# Patient Record
Sex: Female | Born: 1945
Health system: Southern US, Community
[De-identification: ages and names within clinical notes are randomized; demographics above are authoritative.]

## PROBLEM LIST (undated history)

## (undated) DIAGNOSIS — E785 Hyperlipidemia, unspecified: Secondary | ICD-10-CM

## (undated) DIAGNOSIS — F32A Depression, unspecified: Secondary | ICD-10-CM

## (undated) DIAGNOSIS — T7840XA Allergy, unspecified, initial encounter: Secondary | ICD-10-CM

## (undated) DIAGNOSIS — F329 Major depressive disorder, single episode, unspecified: Secondary | ICD-10-CM

## (undated) DIAGNOSIS — R002 Palpitations: Secondary | ICD-10-CM

## (undated) DIAGNOSIS — F419 Anxiety disorder, unspecified: Secondary | ICD-10-CM

## (undated) DIAGNOSIS — R609 Edema, unspecified: Secondary | ICD-10-CM

## (undated) DIAGNOSIS — I10 Essential (primary) hypertension: Secondary | ICD-10-CM

## (undated) DIAGNOSIS — M8580 Other specified disorders of bone density and structure, unspecified site: Secondary | ICD-10-CM

## (undated) DIAGNOSIS — N951 Menopausal and female climacteric states: Secondary | ICD-10-CM

## (undated) HISTORY — DX: Anxiety disorder, unspecified: F41.9

## (undated) HISTORY — DX: Essential (primary) hypertension: I10

## (undated) HISTORY — DX: Palpitations: R00.2

## (undated) HISTORY — DX: Menopausal and female climacteric states: N95.1

## (undated) HISTORY — DX: Allergy, unspecified, initial encounter: T78.40XA

## (undated) HISTORY — DX: Major depressive disorder, single episode, unspecified: F32.9

## (undated) HISTORY — DX: Other specified disorders of bone density and structure, unspecified site: M85.80

## (undated) HISTORY — DX: Hyperlipidemia, unspecified: E78.5

## (undated) HISTORY — DX: Edema, unspecified: R60.9

## (undated) HISTORY — DX: Depression, unspecified: F32.A

---

## 1976-08-16 HISTORY — PX: ABDOMINAL HYSTERECTOMY: SHX81

## 2010-11-19 ENCOUNTER — Ambulatory Visit: Payer: Self-pay | Admitting: Family Medicine

## 2011-03-20 ENCOUNTER — Emergency Department: Payer: Self-pay | Admitting: *Deleted

## 2011-04-01 ENCOUNTER — Ambulatory Visit: Payer: Self-pay | Admitting: Cardiovascular Disease

## 2011-07-14 DIAGNOSIS — Q245 Malformation of coronary vessels: Secondary | ICD-10-CM | POA: Insufficient documentation

## 2011-08-02 ENCOUNTER — Ambulatory Visit: Payer: Self-pay | Admitting: Otolaryngology

## 2011-10-27 DIAGNOSIS — Z1331 Encounter for screening for depression: Secondary | ICD-10-CM | POA: Diagnosis not present

## 2011-10-27 DIAGNOSIS — R002 Palpitations: Secondary | ICD-10-CM | POA: Diagnosis not present

## 2011-10-27 DIAGNOSIS — Z0000 Encounter for general adult medical examination without abnormal findings: Secondary | ICD-10-CM | POA: Diagnosis not present

## 2011-10-27 DIAGNOSIS — I10 Essential (primary) hypertension: Secondary | ICD-10-CM | POA: Diagnosis not present

## 2011-10-27 DIAGNOSIS — E7800 Pure hypercholesterolemia, unspecified: Secondary | ICD-10-CM | POA: Diagnosis not present

## 2011-12-09 DIAGNOSIS — R002 Palpitations: Secondary | ICD-10-CM | POA: Diagnosis not present

## 2011-12-09 DIAGNOSIS — I10 Essential (primary) hypertension: Secondary | ICD-10-CM | POA: Diagnosis not present

## 2011-12-09 DIAGNOSIS — E785 Hyperlipidemia, unspecified: Secondary | ICD-10-CM | POA: Diagnosis not present

## 2011-12-14 ENCOUNTER — Ambulatory Visit: Payer: Self-pay

## 2011-12-14 DIAGNOSIS — Z1231 Encounter for screening mammogram for malignant neoplasm of breast: Secondary | ICD-10-CM | POA: Diagnosis not present

## 2012-01-12 ENCOUNTER — Ambulatory Visit: Payer: Self-pay | Admitting: Otolaryngology

## 2012-01-12 DIAGNOSIS — E042 Nontoxic multinodular goiter: Secondary | ICD-10-CM | POA: Diagnosis not present

## 2012-01-12 DIAGNOSIS — E041 Nontoxic single thyroid nodule: Secondary | ICD-10-CM | POA: Diagnosis not present

## 2012-01-12 DIAGNOSIS — J988 Other specified respiratory disorders: Secondary | ICD-10-CM | POA: Diagnosis not present

## 2012-01-12 DIAGNOSIS — J398 Other specified diseases of upper respiratory tract: Secondary | ICD-10-CM | POA: Diagnosis not present

## 2012-01-17 DIAGNOSIS — E041 Nontoxic single thyroid nodule: Secondary | ICD-10-CM | POA: Diagnosis not present

## 2012-01-17 DIAGNOSIS — E0590 Thyrotoxicosis, unspecified without thyrotoxic crisis or storm: Secondary | ICD-10-CM | POA: Diagnosis not present

## 2012-04-10 DIAGNOSIS — R002 Palpitations: Secondary | ICD-10-CM | POA: Diagnosis not present

## 2012-04-10 DIAGNOSIS — E785 Hyperlipidemia, unspecified: Secondary | ICD-10-CM | POA: Diagnosis not present

## 2012-04-10 DIAGNOSIS — I10 Essential (primary) hypertension: Secondary | ICD-10-CM | POA: Diagnosis not present

## 2012-04-24 DIAGNOSIS — R011 Cardiac murmur, unspecified: Secondary | ICD-10-CM | POA: Diagnosis not present

## 2012-04-27 DIAGNOSIS — I10 Essential (primary) hypertension: Secondary | ICD-10-CM | POA: Diagnosis not present

## 2012-04-27 DIAGNOSIS — E785 Hyperlipidemia, unspecified: Secondary | ICD-10-CM | POA: Diagnosis not present

## 2012-04-27 DIAGNOSIS — E7800 Pure hypercholesterolemia, unspecified: Secondary | ICD-10-CM | POA: Diagnosis not present

## 2012-07-18 DIAGNOSIS — J343 Hypertrophy of nasal turbinates: Secondary | ICD-10-CM | POA: Diagnosis not present

## 2012-07-18 DIAGNOSIS — E0590 Thyrotoxicosis, unspecified without thyrotoxic crisis or storm: Secondary | ICD-10-CM | POA: Diagnosis not present

## 2012-07-18 DIAGNOSIS — J342 Deviated nasal septum: Secondary | ICD-10-CM | POA: Diagnosis not present

## 2012-07-18 DIAGNOSIS — E041 Nontoxic single thyroid nodule: Secondary | ICD-10-CM | POA: Diagnosis not present

## 2012-08-25 DIAGNOSIS — E785 Hyperlipidemia, unspecified: Secondary | ICD-10-CM | POA: Diagnosis not present

## 2012-08-25 DIAGNOSIS — I10 Essential (primary) hypertension: Secondary | ICD-10-CM | POA: Diagnosis not present

## 2012-08-25 DIAGNOSIS — R002 Palpitations: Secondary | ICD-10-CM | POA: Diagnosis not present

## 2012-10-25 DIAGNOSIS — I10 Essential (primary) hypertension: Secondary | ICD-10-CM | POA: Diagnosis not present

## 2012-10-25 DIAGNOSIS — E7800 Pure hypercholesterolemia, unspecified: Secondary | ICD-10-CM | POA: Diagnosis not present

## 2012-10-25 DIAGNOSIS — E785 Hyperlipidemia, unspecified: Secondary | ICD-10-CM | POA: Diagnosis not present

## 2012-11-03 DIAGNOSIS — R7301 Impaired fasting glucose: Secondary | ICD-10-CM | POA: Diagnosis not present

## 2012-11-24 ENCOUNTER — Ambulatory Visit: Payer: Self-pay

## 2012-11-24 DIAGNOSIS — Z7189 Other specified counseling: Secondary | ICD-10-CM | POA: Diagnosis not present

## 2012-11-24 DIAGNOSIS — I10 Essential (primary) hypertension: Secondary | ICD-10-CM | POA: Diagnosis not present

## 2012-11-24 DIAGNOSIS — E119 Type 2 diabetes mellitus without complications: Secondary | ICD-10-CM | POA: Diagnosis not present

## 2012-11-24 DIAGNOSIS — E785 Hyperlipidemia, unspecified: Secondary | ICD-10-CM | POA: Diagnosis not present

## 2012-12-14 ENCOUNTER — Ambulatory Visit: Payer: Self-pay

## 2012-12-14 DIAGNOSIS — E119 Type 2 diabetes mellitus without complications: Secondary | ICD-10-CM | POA: Diagnosis not present

## 2012-12-14 DIAGNOSIS — M899 Disorder of bone, unspecified: Secondary | ICD-10-CM | POA: Diagnosis not present

## 2012-12-14 DIAGNOSIS — Z7189 Other specified counseling: Secondary | ICD-10-CM | POA: Diagnosis not present

## 2012-12-14 DIAGNOSIS — E785 Hyperlipidemia, unspecified: Secondary | ICD-10-CM | POA: Diagnosis not present

## 2012-12-14 DIAGNOSIS — M949 Disorder of cartilage, unspecified: Secondary | ICD-10-CM | POA: Diagnosis not present

## 2012-12-14 DIAGNOSIS — Z1231 Encounter for screening mammogram for malignant neoplasm of breast: Secondary | ICD-10-CM | POA: Diagnosis not present

## 2012-12-14 DIAGNOSIS — R928 Other abnormal and inconclusive findings on diagnostic imaging of breast: Secondary | ICD-10-CM | POA: Diagnosis not present

## 2012-12-14 DIAGNOSIS — I10 Essential (primary) hypertension: Secondary | ICD-10-CM | POA: Diagnosis not present

## 2012-12-14 DIAGNOSIS — N951 Menopausal and female climacteric states: Secondary | ICD-10-CM | POA: Diagnosis not present

## 2012-12-20 ENCOUNTER — Ambulatory Visit: Payer: Self-pay

## 2013-01-14 ENCOUNTER — Ambulatory Visit: Payer: Self-pay

## 2013-01-31 DIAGNOSIS — I10 Essential (primary) hypertension: Secondary | ICD-10-CM | POA: Diagnosis not present

## 2013-01-31 DIAGNOSIS — E119 Type 2 diabetes mellitus without complications: Secondary | ICD-10-CM | POA: Diagnosis not present

## 2013-01-31 DIAGNOSIS — E7800 Pure hypercholesterolemia, unspecified: Secondary | ICD-10-CM | POA: Diagnosis not present

## 2013-02-15 DIAGNOSIS — M76829 Posterior tibial tendinitis, unspecified leg: Secondary | ICD-10-CM | POA: Diagnosis not present

## 2013-02-15 DIAGNOSIS — L03039 Cellulitis of unspecified toe: Secondary | ICD-10-CM | POA: Diagnosis not present

## 2013-02-15 DIAGNOSIS — M79609 Pain in unspecified limb: Secondary | ICD-10-CM | POA: Diagnosis not present

## 2013-02-26 DIAGNOSIS — E785 Hyperlipidemia, unspecified: Secondary | ICD-10-CM | POA: Diagnosis not present

## 2013-02-26 DIAGNOSIS — R002 Palpitations: Secondary | ICD-10-CM | POA: Diagnosis not present

## 2013-02-26 DIAGNOSIS — I10 Essential (primary) hypertension: Secondary | ICD-10-CM | POA: Diagnosis not present

## 2013-03-01 DIAGNOSIS — L03039 Cellulitis of unspecified toe: Secondary | ICD-10-CM | POA: Diagnosis not present

## 2013-03-09 DIAGNOSIS — R011 Cardiac murmur, unspecified: Secondary | ICD-10-CM | POA: Diagnosis not present

## 2013-03-09 DIAGNOSIS — R002 Palpitations: Secondary | ICD-10-CM | POA: Diagnosis not present

## 2013-05-14 DIAGNOSIS — E785 Hyperlipidemia, unspecified: Secondary | ICD-10-CM | POA: Diagnosis not present

## 2013-05-14 DIAGNOSIS — E119 Type 2 diabetes mellitus without complications: Secondary | ICD-10-CM | POA: Diagnosis not present

## 2013-05-14 DIAGNOSIS — I10 Essential (primary) hypertension: Secondary | ICD-10-CM | POA: Diagnosis not present

## 2013-05-14 DIAGNOSIS — Z0000 Encounter for general adult medical examination without abnormal findings: Secondary | ICD-10-CM | POA: Diagnosis not present

## 2013-05-14 DIAGNOSIS — E7800 Pure hypercholesterolemia, unspecified: Secondary | ICD-10-CM | POA: Diagnosis not present

## 2013-05-15 DIAGNOSIS — Z0000 Encounter for general adult medical examination without abnormal findings: Secondary | ICD-10-CM | POA: Diagnosis not present

## 2013-05-15 DIAGNOSIS — E119 Type 2 diabetes mellitus without complications: Secondary | ICD-10-CM | POA: Diagnosis not present

## 2013-05-31 DIAGNOSIS — M76829 Posterior tibial tendinitis, unspecified leg: Secondary | ICD-10-CM | POA: Diagnosis not present

## 2013-05-31 DIAGNOSIS — M79609 Pain in unspecified limb: Secondary | ICD-10-CM | POA: Diagnosis not present

## 2013-06-18 DIAGNOSIS — H269 Unspecified cataract: Secondary | ICD-10-CM | POA: Diagnosis not present

## 2013-06-18 DIAGNOSIS — H524 Presbyopia: Secondary | ICD-10-CM | POA: Diagnosis not present

## 2013-06-18 DIAGNOSIS — H5200 Hypermetropia, unspecified eye: Secondary | ICD-10-CM | POA: Diagnosis not present

## 2013-06-18 DIAGNOSIS — H52229 Regular astigmatism, unspecified eye: Secondary | ICD-10-CM | POA: Diagnosis not present

## 2013-06-21 DIAGNOSIS — M79609 Pain in unspecified limb: Secondary | ICD-10-CM | POA: Diagnosis not present

## 2013-06-21 DIAGNOSIS — M76829 Posterior tibial tendinitis, unspecified leg: Secondary | ICD-10-CM | POA: Diagnosis not present

## 2013-07-30 DIAGNOSIS — I10 Essential (primary) hypertension: Secondary | ICD-10-CM | POA: Diagnosis not present

## 2013-07-30 DIAGNOSIS — R079 Chest pain, unspecified: Secondary | ICD-10-CM | POA: Diagnosis not present

## 2013-07-30 DIAGNOSIS — R002 Palpitations: Secondary | ICD-10-CM | POA: Diagnosis not present

## 2013-08-01 DIAGNOSIS — R072 Precordial pain: Secondary | ICD-10-CM | POA: Diagnosis not present

## 2013-08-01 DIAGNOSIS — R079 Chest pain, unspecified: Secondary | ICD-10-CM | POA: Diagnosis not present

## 2013-08-03 DIAGNOSIS — I059 Rheumatic mitral valve disease, unspecified: Secondary | ICD-10-CM | POA: Diagnosis not present

## 2013-08-03 DIAGNOSIS — R002 Palpitations: Secondary | ICD-10-CM | POA: Diagnosis not present

## 2013-08-03 DIAGNOSIS — I10 Essential (primary) hypertension: Secondary | ICD-10-CM | POA: Diagnosis not present

## 2013-08-03 DIAGNOSIS — E785 Hyperlipidemia, unspecified: Secondary | ICD-10-CM | POA: Diagnosis not present

## 2013-10-05 DIAGNOSIS — I10 Essential (primary) hypertension: Secondary | ICD-10-CM | POA: Diagnosis not present

## 2013-10-05 DIAGNOSIS — E785 Hyperlipidemia, unspecified: Secondary | ICD-10-CM | POA: Diagnosis not present

## 2013-10-05 DIAGNOSIS — R002 Palpitations: Secondary | ICD-10-CM | POA: Diagnosis not present

## 2013-10-19 DIAGNOSIS — E785 Hyperlipidemia, unspecified: Secondary | ICD-10-CM | POA: Diagnosis not present

## 2013-10-19 DIAGNOSIS — R002 Palpitations: Secondary | ICD-10-CM | POA: Diagnosis not present

## 2013-10-19 DIAGNOSIS — I10 Essential (primary) hypertension: Secondary | ICD-10-CM | POA: Diagnosis not present

## 2013-10-19 DIAGNOSIS — I059 Rheumatic mitral valve disease, unspecified: Secondary | ICD-10-CM | POA: Diagnosis not present

## 2013-11-12 DIAGNOSIS — E785 Hyperlipidemia, unspecified: Secondary | ICD-10-CM | POA: Diagnosis not present

## 2013-11-12 DIAGNOSIS — I10 Essential (primary) hypertension: Secondary | ICD-10-CM | POA: Diagnosis not present

## 2013-11-12 DIAGNOSIS — E7800 Pure hypercholesterolemia, unspecified: Secondary | ICD-10-CM | POA: Diagnosis not present

## 2013-11-12 DIAGNOSIS — E119 Type 2 diabetes mellitus without complications: Secondary | ICD-10-CM | POA: Diagnosis not present

## 2013-12-18 ENCOUNTER — Ambulatory Visit: Payer: Self-pay

## 2013-12-18 DIAGNOSIS — Z1231 Encounter for screening mammogram for malignant neoplasm of breast: Secondary | ICD-10-CM | POA: Diagnosis not present

## 2014-02-01 DIAGNOSIS — R002 Palpitations: Secondary | ICD-10-CM | POA: Diagnosis not present

## 2014-02-01 DIAGNOSIS — E785 Hyperlipidemia, unspecified: Secondary | ICD-10-CM | POA: Diagnosis not present

## 2014-02-01 DIAGNOSIS — I10 Essential (primary) hypertension: Secondary | ICD-10-CM | POA: Diagnosis not present

## 2014-05-13 DIAGNOSIS — M25473 Effusion, unspecified ankle: Secondary | ICD-10-CM | POA: Diagnosis not present

## 2014-05-13 DIAGNOSIS — E8881 Metabolic syndrome: Secondary | ICD-10-CM | POA: Diagnosis not present

## 2014-05-13 DIAGNOSIS — E785 Hyperlipidemia, unspecified: Secondary | ICD-10-CM | POA: Diagnosis not present

## 2014-05-13 DIAGNOSIS — I10 Essential (primary) hypertension: Secondary | ICD-10-CM | POA: Diagnosis not present

## 2014-05-13 DIAGNOSIS — M25476 Effusion, unspecified foot: Secondary | ICD-10-CM | POA: Diagnosis not present

## 2014-06-12 DIAGNOSIS — E875 Hyperkalemia: Secondary | ICD-10-CM | POA: Diagnosis not present

## 2014-08-05 DIAGNOSIS — E785 Hyperlipidemia, unspecified: Secondary | ICD-10-CM | POA: Diagnosis not present

## 2014-08-05 DIAGNOSIS — R002 Palpitations: Secondary | ICD-10-CM | POA: Diagnosis not present

## 2014-08-05 DIAGNOSIS — I10 Essential (primary) hypertension: Secondary | ICD-10-CM | POA: Diagnosis not present

## 2014-08-05 DIAGNOSIS — R079 Chest pain, unspecified: Secondary | ICD-10-CM | POA: Diagnosis not present

## 2014-08-05 DIAGNOSIS — I340 Nonrheumatic mitral (valve) insufficiency: Secondary | ICD-10-CM | POA: Diagnosis not present

## 2014-08-13 DIAGNOSIS — R7301 Impaired fasting glucose: Secondary | ICD-10-CM | POA: Diagnosis not present

## 2014-08-13 DIAGNOSIS — I10 Essential (primary) hypertension: Secondary | ICD-10-CM | POA: Diagnosis not present

## 2014-08-19 DIAGNOSIS — R079 Chest pain, unspecified: Secondary | ICD-10-CM | POA: Diagnosis not present

## 2014-08-22 DIAGNOSIS — R002 Palpitations: Secondary | ICD-10-CM | POA: Diagnosis not present

## 2014-08-22 DIAGNOSIS — I10 Essential (primary) hypertension: Secondary | ICD-10-CM | POA: Diagnosis not present

## 2014-08-22 DIAGNOSIS — E785 Hyperlipidemia, unspecified: Secondary | ICD-10-CM | POA: Diagnosis not present

## 2014-08-22 DIAGNOSIS — I340 Nonrheumatic mitral (valve) insufficiency: Secondary | ICD-10-CM | POA: Diagnosis not present

## 2014-08-22 DIAGNOSIS — R609 Edema, unspecified: Secondary | ICD-10-CM | POA: Diagnosis not present

## 2014-09-02 DIAGNOSIS — H52223 Regular astigmatism, bilateral: Secondary | ICD-10-CM | POA: Diagnosis not present

## 2014-09-02 DIAGNOSIS — I10 Essential (primary) hypertension: Secondary | ICD-10-CM | POA: Diagnosis not present

## 2014-09-02 DIAGNOSIS — H5203 Hypermetropia, bilateral: Secondary | ICD-10-CM | POA: Diagnosis not present

## 2014-09-02 DIAGNOSIS — H524 Presbyopia: Secondary | ICD-10-CM | POA: Diagnosis not present

## 2014-09-02 DIAGNOSIS — H2513 Age-related nuclear cataract, bilateral: Secondary | ICD-10-CM | POA: Diagnosis not present

## 2014-10-10 DIAGNOSIS — E785 Hyperlipidemia, unspecified: Secondary | ICD-10-CM | POA: Diagnosis not present

## 2014-10-10 DIAGNOSIS — I10 Essential (primary) hypertension: Secondary | ICD-10-CM | POA: Diagnosis not present

## 2014-10-10 DIAGNOSIS — R002 Palpitations: Secondary | ICD-10-CM | POA: Diagnosis not present

## 2014-10-24 ENCOUNTER — Ambulatory Visit: Payer: Self-pay | Admitting: Gastroenterology

## 2014-10-24 DIAGNOSIS — K5140 Inflammatory polyps of colon without complications: Secondary | ICD-10-CM | POA: Diagnosis not present

## 2014-10-24 DIAGNOSIS — K5730 Diverticulosis of large intestine without perforation or abscess without bleeding: Secondary | ICD-10-CM | POA: Diagnosis not present

## 2014-10-24 DIAGNOSIS — Z1211 Encounter for screening for malignant neoplasm of colon: Secondary | ICD-10-CM | POA: Diagnosis not present

## 2014-10-24 DIAGNOSIS — D122 Benign neoplasm of ascending colon: Secondary | ICD-10-CM | POA: Diagnosis not present

## 2014-10-24 DIAGNOSIS — Z7982 Long term (current) use of aspirin: Secondary | ICD-10-CM | POA: Diagnosis not present

## 2014-10-24 DIAGNOSIS — I10 Essential (primary) hypertension: Secondary | ICD-10-CM | POA: Diagnosis not present

## 2014-10-24 DIAGNOSIS — I499 Cardiac arrhythmia, unspecified: Secondary | ICD-10-CM | POA: Diagnosis not present

## 2014-10-24 DIAGNOSIS — K635 Polyp of colon: Secondary | ICD-10-CM | POA: Diagnosis not present

## 2014-10-24 DIAGNOSIS — K5790 Diverticulosis of intestine, part unspecified, without perforation or abscess without bleeding: Secondary | ICD-10-CM | POA: Diagnosis not present

## 2014-10-24 LAB — HM COLONOSCOPY

## 2014-12-03 DIAGNOSIS — E785 Hyperlipidemia, unspecified: Secondary | ICD-10-CM | POA: Diagnosis not present

## 2014-12-03 DIAGNOSIS — I340 Nonrheumatic mitral (valve) insufficiency: Secondary | ICD-10-CM | POA: Diagnosis not present

## 2014-12-03 DIAGNOSIS — I10 Essential (primary) hypertension: Secondary | ICD-10-CM | POA: Diagnosis not present

## 2014-12-03 DIAGNOSIS — R079 Chest pain, unspecified: Secondary | ICD-10-CM | POA: Diagnosis not present

## 2014-12-09 LAB — SURGICAL PATHOLOGY

## 2014-12-11 DIAGNOSIS — I10 Essential (primary) hypertension: Secondary | ICD-10-CM | POA: Diagnosis not present

## 2014-12-11 DIAGNOSIS — E785 Hyperlipidemia, unspecified: Secondary | ICD-10-CM | POA: Diagnosis not present

## 2014-12-11 DIAGNOSIS — R9430 Abnormal result of cardiovascular function study, unspecified: Secondary | ICD-10-CM | POA: Diagnosis not present

## 2014-12-11 DIAGNOSIS — R002 Palpitations: Secondary | ICD-10-CM | POA: Diagnosis not present

## 2014-12-12 ENCOUNTER — Other Ambulatory Visit: Payer: Self-pay

## 2014-12-12 DIAGNOSIS — Z1231 Encounter for screening mammogram for malignant neoplasm of breast: Secondary | ICD-10-CM

## 2014-12-16 DIAGNOSIS — R002 Palpitations: Secondary | ICD-10-CM | POA: Diagnosis not present

## 2014-12-16 DIAGNOSIS — R9430 Abnormal result of cardiovascular function study, unspecified: Secondary | ICD-10-CM | POA: Diagnosis not present

## 2014-12-16 DIAGNOSIS — E785 Hyperlipidemia, unspecified: Secondary | ICD-10-CM | POA: Diagnosis not present

## 2014-12-16 DIAGNOSIS — I10 Essential (primary) hypertension: Secondary | ICD-10-CM | POA: Diagnosis not present

## 2014-12-31 ENCOUNTER — Other Ambulatory Visit: Payer: Self-pay

## 2014-12-31 ENCOUNTER — Ambulatory Visit
Admission: RE | Admit: 2014-12-31 | Discharge: 2014-12-31 | Disposition: A | Discharge status: Home / Self Care | Payer: Medicare Other | Source: Ambulatory Visit | Attending: Unknown Physician Specialty | Admitting: Unknown Physician Specialty

## 2014-12-31 DIAGNOSIS — Z1231 Encounter for screening mammogram for malignant neoplasm of breast: Secondary | ICD-10-CM

## 2015-02-12 ENCOUNTER — Encounter: Payer: Self-pay | Admitting: Unknown Physician Specialty

## 2015-02-12 ENCOUNTER — Ambulatory Visit (INDEPENDENT_AMBULATORY_CARE_PROVIDER_SITE_OTHER): Payer: Medicare Other | Admitting: Unknown Physician Specialty

## 2015-02-12 DIAGNOSIS — R002 Palpitations: Secondary | ICD-10-CM | POA: Insufficient documentation

## 2015-02-12 DIAGNOSIS — E785 Hyperlipidemia, unspecified: Secondary | ICD-10-CM

## 2015-02-12 DIAGNOSIS — I159 Secondary hypertension, unspecified: Secondary | ICD-10-CM | POA: Diagnosis not present

## 2015-02-12 DIAGNOSIS — E1159 Type 2 diabetes mellitus with other circulatory complications: Secondary | ICD-10-CM | POA: Insufficient documentation

## 2015-02-12 DIAGNOSIS — E1169 Type 2 diabetes mellitus with other specified complication: Secondary | ICD-10-CM | POA: Insufficient documentation

## 2015-02-12 DIAGNOSIS — M85852 Other specified disorders of bone density and structure, left thigh: Secondary | ICD-10-CM | POA: Insufficient documentation

## 2015-02-12 DIAGNOSIS — E8881 Metabolic syndrome: Secondary | ICD-10-CM | POA: Diagnosis not present

## 2015-02-12 DIAGNOSIS — F419 Anxiety disorder, unspecified: Secondary | ICD-10-CM | POA: Insufficient documentation

## 2015-02-12 DIAGNOSIS — I152 Hypertension secondary to endocrine disorders: Secondary | ICD-10-CM | POA: Insufficient documentation

## 2015-02-12 DIAGNOSIS — M8580 Other specified disorders of bone density and structure, unspecified site: Secondary | ICD-10-CM | POA: Insufficient documentation

## 2015-02-12 DIAGNOSIS — R7301 Impaired fasting glucose: Secondary | ICD-10-CM | POA: Insufficient documentation

## 2015-02-12 DIAGNOSIS — F32A Depression, unspecified: Secondary | ICD-10-CM | POA: Insufficient documentation

## 2015-02-12 DIAGNOSIS — F329 Major depressive disorder, single episode, unspecified: Secondary | ICD-10-CM | POA: Insufficient documentation

## 2015-02-12 DIAGNOSIS — I10 Essential (primary) hypertension: Secondary | ICD-10-CM | POA: Insufficient documentation

## 2015-02-12 DIAGNOSIS — M25473 Effusion, unspecified ankle: Secondary | ICD-10-CM | POA: Insufficient documentation

## 2015-02-12 LAB — LIPID PANEL PICCOLO, WAIVED
Chol/HDL Ratio Piccolo,Waive: 3.1 mg/dL
Cholesterol Piccolo, Waived: 148 mg/dL (ref <200)
HDL Chol Piccolo, Waived: 47 mg/dL — ABNORMAL LOW (ref >59)
LDL CHOL CALC PICCOLO WAIVED: 84 mg/dL (ref <100)
Triglycerides Piccolo,Waived: 80 mg/dL (ref <150)
VLDL Chol Calc Piccolo,Waive: 16 mg/dL (ref <30)

## 2015-02-12 LAB — MICROALBUMIN, URINE WAIVED
Creatinine, Urine Waived: 200 mg/dL (ref 10–300)
MICROALB, UR WAIVED: 10 mg/L (ref 0–19)
Microalb/Creat Ratio: <30 mg/g (ref <30)

## 2015-02-12 NOTE — Assessment & Plan Note (Signed)
Stable.  Continue current medications.

## 2015-02-12 NOTE — Assessment & Plan Note (Signed)
Check Glucose

## 2015-02-12 NOTE — Patient Instructions (Signed)
Stop Furosemide as I think the combination with HCTZ is to much.  Stop KCL tablet.    If you take magnesium, look for Magnesium Citrate aor Glycerate.  Don't take Magnesium Oxide.  It is not well absorbed.

## 2015-02-12 NOTE — Progress Notes (Signed)
   BP 119/73 mmHg  Pulse 69  Temp(Src) 98.3 F (36.8 C)  Ht 5' 5.9" (1.674 m)  Wt 202 lb 6.4 oz (91.808 kg)  BMI 32.76 kg/m2  SpO2 98%  LMP  (LMP Unknown)   Subjective:    Patient ID: Denise Morris, female    DOB: Jun 21, 1946, 69 y.o.   MRN: [id number]  HPI: Denise Morris is a 69 y.o. female  Chief Complaint  Patient presents with  . Hypertension  . Hyperlipidemia  . Medication Problem    pt states medication is causing her to be dizzy (losartan)   Hypertension This is a chronic (Feels Losartan is making her dizzy.  States Dr. Humphrey Rolls increased her Losartan HCTZ but also taking Fuosemide) problem. The problem is controlled. Associated symptoms include peripheral edema. Pertinent negatives include no chest pain or shortness of breath. Associated agents: has palpitations and sees Dr. Humphrey Rolls. There are no compliance problems.   Hyperlipidemia This is a chronic problem. The problem is controlled. Pertinent negatives include no chest pain, focal sensory loss, focal weakness, leg pain, myalgias or shortness of breath. Current antihyperlipidemic treatment includes statins.    Relevant past medical, surgical, family and social history reviewed and updated as indicated. Interim medical history since our last visit reviewed. Allergies and medications reviewed and updated.  Review of Systems  Respiratory: Negative for shortness of breath.   Cardiovascular: Negative for chest pain.  Musculoskeletal: Negative for myalgias.  Neurological: Negative for focal weakness.    Per HPI unless specifically indicated above     Objective:    BP 119/73 mmHg  Pulse 69  Temp(Src) 98.3 F (36.8 C)  Ht 5' 5.9" (1.674 m)  Wt 202 lb 6.4 oz (91.808 kg)  BMI 32.76 kg/m2  SpO2 98%  LMP  (LMP Unknown)  Wt Readings from Last 3 Encounters:  02/12/15 202 lb 6.4 oz (91.808 kg)  08/13/14 206 lb (93.441 kg)    Physical Exam  Constitutional: She is oriented to person, place, and time. She appears  well-developed and well-nourished. No distress.  HENT:  Head: Normocephalic and atraumatic.  Eyes: Conjunctivae and lids are normal. Right eye exhibits no discharge. Left eye exhibits no discharge. No scleral icterus.  Cardiovascular: Normal rate and regular rhythm.   Pulmonary/Chest: Effort normal and breath sounds normal. No respiratory distress.  Abdominal: Normal appearance and bowel sounds are normal. She exhibits no distension. There is no splenomegaly or hepatomegaly. There is no tenderness.  Musculoskeletal: Normal range of motion.  Neurological: She is alert and oriented to person, place, and time.  Skin: Skin is intact. No rash noted. No pallor.  Psychiatric: She has a normal mood and affect. Her behavior is normal. Judgment and thought content normal.   Assessment & Plan:   Problem List Items Addressed This Visit      Cardiovascular and Mediastinum   Hypertension    Stable but dizzy and light headed.  Stop Furosemide and KCL      Relevant Orders   Comprehensive metabolic panel   Microalbumin, Urine Waived     Other   Hyperlipidemia    Stable.  Continue current medications      Relevant Orders   Lipid Panel Piccolo, Waived   Metabolic syndrome    Check Glucose          Follow up plan: Return in about 6 months (around 08/14/2015) for P.

## 2015-02-12 NOTE — Assessment & Plan Note (Signed)
Stable but dizzy and light headed.  Stop Furosemide and KCL

## 2015-02-13 LAB — COMPREHENSIVE METABOLIC PANEL
ALT: 14 IU/L (ref 0–32)
AST: 18 IU/L (ref 0–40)
Albumin/Globulin Ratio: 1.5 (ref 1.1–2.5)
Albumin: 3.8 g/dL (ref 3.5–4.8)
Alkaline Phosphatase: 76 IU/L (ref 39–117)
BUN/Creatinine Ratio: 16 (ref 11–26)
BUN: 16 mg/dL (ref 8–27)
Bilirubin Total: 0.6 mg/dL (ref 0.0–1.2)
CALCIUM: 9.1 mg/dL (ref 8.7–10.3)
CO2: 26 mmol/L (ref 18–29)
Chloride: 101 mmol/L (ref 97–108)
Creatinine, Ser: 0.98 mg/dL (ref 0.57–1.00)
GFR calc Af Amer: 67 mL/min/{1.73_m2} (ref >59)
GFR calc non Af Amer: 58 mL/min/{1.73_m2} — ABNORMAL LOW (ref >59)
GLOBULIN, TOTAL: 2.6 g/dL (ref 1.5–4.5)
Glucose: 109 mg/dL — ABNORMAL HIGH (ref 65–99)
Potassium: 3.7 mmol/L (ref 3.5–5.2)
SODIUM: 142 mmol/L (ref 134–144)
Total Protein: 6.4 g/dL (ref 6.0–8.5)

## 2015-02-13 NOTE — Progress Notes (Signed)
Quick Note:  Call tell labs normal except kidney function just a little low. No real change form last visit. Treatment is keeping blood pressure under control which it is. Also avoid anti-inflammatories such as Ibuprofen and Advil. Tylenol in moderation is OK. We will continue to keep an eye on it. She is at 58% and the goal is above 60%. ______

## 2015-04-04 ENCOUNTER — Other Ambulatory Visit: Payer: Self-pay

## 2015-04-04 MED ORDER — PRAVASTATIN SODIUM 40 MG PO TABS: 40.0000 mg | ORAL_TABLET | Freq: Every day | ORAL | Status: DC

## 2015-04-04 NOTE — Telephone Encounter (Signed)
Patient was last seen 02/12/15 and pharmacy is [facility] in [place].

## 2015-04-24 DIAGNOSIS — I340 Nonrheumatic mitral (valve) insufficiency: Secondary | ICD-10-CM | POA: Diagnosis not present

## 2015-04-24 DIAGNOSIS — R002 Palpitations: Secondary | ICD-10-CM | POA: Diagnosis not present

## 2015-04-24 DIAGNOSIS — E785 Hyperlipidemia, unspecified: Secondary | ICD-10-CM | POA: Diagnosis not present

## 2015-04-24 DIAGNOSIS — I10 Essential (primary) hypertension: Secondary | ICD-10-CM | POA: Diagnosis not present

## 2015-07-03 ENCOUNTER — Other Ambulatory Visit: Payer: Self-pay | Admitting: Unknown Physician Specialty

## 2015-08-20 ENCOUNTER — Encounter: Payer: Medicare Other | Admitting: Unknown Physician Specialty

## 2015-09-16 ENCOUNTER — Ambulatory Visit (INDEPENDENT_AMBULATORY_CARE_PROVIDER_SITE_OTHER): Payer: Medicare Other | Admitting: Unknown Physician Specialty

## 2015-09-16 ENCOUNTER — Encounter: Payer: Self-pay | Admitting: Unknown Physician Specialty

## 2015-09-16 VITALS — BP 127/74 | HR 71 | Temp 98.3°F | Ht 66.0 in | Wt 211.0 lb

## 2015-09-16 DIAGNOSIS — Z0000 Encounter for general adult medical examination without abnormal findings: Secondary | ICD-10-CM

## 2015-09-16 DIAGNOSIS — E2839 Other primary ovarian failure: Secondary | ICD-10-CM

## 2015-09-16 DIAGNOSIS — I10 Essential (primary) hypertension: Secondary | ICD-10-CM | POA: Diagnosis not present

## 2015-09-16 DIAGNOSIS — E785 Hyperlipidemia, unspecified: Secondary | ICD-10-CM | POA: Diagnosis not present

## 2015-09-16 MED ORDER — PRAVASTATIN SODIUM 40 MG PO TABS: 40.0000 mg | ORAL_TABLET | Freq: Every day | ORAL | Status: DC

## 2015-09-16 MED ORDER — LOSARTAN POTASSIUM-HCTZ 100-25 MG PO TABS: 1.0000 | ORAL_TABLET | Freq: Every day | ORAL | Status: DC

## 2015-09-16 NOTE — Assessment & Plan Note (Signed)
Stable, continue present medications.   

## 2015-09-16 NOTE — Patient Instructions (Signed)
Please do call to schedule your bone density; the number to schedule one at either [facility] or [facility] is [phone]   

## 2015-09-16 NOTE — Progress Notes (Signed)
+   BP 127/74 mmHg  Pulse 71  Temp(Src) 98.3 F (36.8 C)  Ht 5\' 6"  (1.676 m)  Wt 211 lb (95.709 kg)  BMI 34.07 kg/m2  SpO2 96%  LMP  (LMP Unknown)   Subjective:    Patient ID: Denise Morris, female    DOB: 04/13/1946, 70 y.o.   MRN: [id number]  HPI: Denise Morris is a 70 y.o. female  Chief Complaint  Patient presents with  . Medicare Wellness   Hypertension Using medications without difficulty Average home BPs 130/80 or less  No problems or lightheadedness No chest pain with exertion or shortness of breath Edema unchanged   Hyperlipidemia Using medications without problems No Muscle aches  Diet compliance: Watches what she eats Exercise:  Walks in the park 4-5 times/week  Functional Status Survey: Is the patient deaf or have difficulty hearing?: No Does the patient have difficulty seeing, even when wearing glasses/contacts?: No Does the patient have difficulty concentrating, remembering, or making decisions?: No Does the patient have difficulty walking or climbing stairs?: No Does the patient have difficulty dressing or bathing?: No Does the patient have difficulty doing errands alone such as visiting a doctor's office or shopping?: No  Fall Risk  09/16/2015  Falls in the past year? Yes  Number falls in past yr: 1  Injury with Fall? No   Depression screen PHQ 2/9 09/16/2015  Decreased Interest 0  Down, Depressed, Hopeless 0  PHQ - 2 Score 0   Advanced directives reviewed.    Relevant past medical, surgical, family and social history reviewed and updated as indicated. Interim medical history since our last visit reviewed. Allergies and medications reviewed and updated.  See functional status, depression screen, and fall's risk assessment  under the appropriate section.    Pt is able to perform complex mental tasks, recognize clock face, recognize time and do a 3 item recall.      Relevant past medical, surgical, family and social history reviewed and  updated as indicated. Interim medical history since our last visit reviewed. Allergies and medications reviewed and updated.  Review of Systems  Constitutional: Negative.   HENT: Negative.   Eyes: Negative.   Respiratory: Negative.   Cardiovascular: Negative.   Gastrointestinal: Negative.   Endocrine: Negative.   Genitourinary: Negative.   Musculoskeletal: Negative.   Skin: Negative.   Allergic/Immunologic: Negative.   Neurological: Negative.   Hematological: Negative.   Psychiatric/Behavioral: Negative.     Per HPI unless specifically indicated above     Objective:    BP 127/74 mmHg  Pulse 71  Temp(Src) 98.3 F (36.8 C)  Ht 5\' 6"  (1.676 m)  Wt 211 lb (95.709 kg)  BMI 34.07 kg/m2  SpO2 96%  LMP  (LMP Unknown)  Wt Readings from Last 3 Encounters:  09/16/15 211 lb (95.709 kg)  02/12/15 202 lb 6.4 oz (91.808 kg)  08/13/14 206 lb (93.441 kg)    Physical Exam  Constitutional: She is oriented to person, place, and time. She appears well-developed and well-nourished.  HENT:  Head: Normocephalic and atraumatic.  Eyes: Pupils are equal, round, and reactive to light. Right eye exhibits no discharge. Left eye exhibits no discharge. No scleral icterus.  Neck: Normal range of motion. Neck supple. Carotid bruit is not present. No thyromegaly present.  Cardiovascular: Normal rate, regular rhythm and normal heart sounds.  Exam reveals no gallop and no friction rub.   No murmur heard. Pulmonary/Chest: Effort normal and breath sounds normal. No respiratory distress. She  has no wheezes. She has no rales.  Abdominal: Soft. Bowel sounds are normal. There is no tenderness. There is no rebound.  Genitourinary: No breast swelling, tenderness or discharge.  Musculoskeletal: Normal range of motion.  Lymphadenopathy:    She has no cervical adenopathy.  Neurological: She is alert and oriented to person, place, and time.  Skin: Skin is warm, dry and intact. No rash noted.  Psychiatric: She  has a normal mood and affect. Her speech is normal and behavior is normal. Judgment and thought content normal. Cognition and memory are normal.      Assessment & Plan:   Problem List Items Addressed This Visit      Unprioritized   Hyperlipidemia    Stable, continue present medications.        Relevant Medications   losartan-hydrochlorothiazide (HYZAAR) 100-25 MG tablet   pravastatin (PRAVACHOL) 40 MG tablet   Hypertension - Primary    Stable.  Continue present meds      Relevant Medications   losartan-hydrochlorothiazide (HYZAAR) 100-25 MG tablet   pravastatin (PRAVACHOL) 40 MG tablet   Other Relevant Orders   Comprehensive metabolic panel    Other Visit Diagnoses    Routine general medical examination at a health care facility        Relevant Orders    Hepatitis C antibody    Ovarian failure        Relevant Orders    DG Bone Density        Follow up plan: Return in about 6 months (around 03/15/2016).

## 2015-09-16 NOTE — Assessment & Plan Note (Signed)
Stable.  Continue present meds 

## 2015-09-17 ENCOUNTER — Encounter: Payer: Self-pay | Admitting: Unknown Physician Specialty

## 2015-09-17 LAB — COMPREHENSIVE METABOLIC PANEL
A/G RATIO: 1.3 (ref 1.1–2.5)
ALT: 16 IU/L (ref 0–32)
AST: 17 IU/L (ref 0–40)
Albumin: 3.7 g/dL (ref 3.5–4.8)
Alkaline Phosphatase: 82 IU/L (ref 39–117)
BUN/Creatinine Ratio: 17 (ref 11–26)
BUN: 15 mg/dL (ref 8–27)
Bilirubin Total: 0.6 mg/dL (ref 0.0–1.2)
CALCIUM: 8.8 mg/dL (ref 8.7–10.3)
CO2: 24 mmol/L (ref 18–29)
CREATININE: 0.9 mg/dL (ref 0.57–1.00)
Chloride: 99 mmol/L (ref 96–106)
GFR, EST AFRICAN AMERICAN: 74 mL/min/{1.73_m2} (ref >59)
GFR, EST NON AFRICAN AMERICAN: 65 mL/min/{1.73_m2} (ref >59)
GLOBULIN, TOTAL: 2.8 g/dL (ref 1.5–4.5)
Glucose: 102 mg/dL — ABNORMAL HIGH (ref 65–99)
POTASSIUM: 3.7 mmol/L (ref 3.5–5.2)
Sodium: 142 mmol/L (ref 134–144)
TOTAL PROTEIN: 6.5 g/dL (ref 6.0–8.5)

## 2015-09-17 LAB — HEPATITIS C ANTIBODY: Hep C Virus Ab: <0.1 s/co ratio (ref 0.0–0.9)

## 2015-09-25 DIAGNOSIS — I10 Essential (primary) hypertension: Secondary | ICD-10-CM | POA: Diagnosis not present

## 2015-09-25 DIAGNOSIS — R609 Edema, unspecified: Secondary | ICD-10-CM | POA: Diagnosis not present

## 2015-09-25 DIAGNOSIS — E785 Hyperlipidemia, unspecified: Secondary | ICD-10-CM | POA: Diagnosis not present

## 2015-09-25 DIAGNOSIS — I739 Peripheral vascular disease, unspecified: Secondary | ICD-10-CM | POA: Diagnosis not present

## 2015-09-25 DIAGNOSIS — I340 Nonrheumatic mitral (valve) insufficiency: Secondary | ICD-10-CM | POA: Diagnosis not present

## 2015-09-25 DIAGNOSIS — I350 Nonrheumatic aortic (valve) stenosis: Secondary | ICD-10-CM | POA: Diagnosis not present

## 2015-09-25 DIAGNOSIS — R002 Palpitations: Secondary | ICD-10-CM | POA: Diagnosis not present

## 2015-10-06 ENCOUNTER — Ambulatory Visit
Admission: RE | Admit: 2015-10-06 | Discharge: 2015-10-06 | Disposition: A | Discharge status: Home / Self Care | Payer: Medicare Other | Source: Ambulatory Visit | Attending: Unknown Physician Specialty | Admitting: Unknown Physician Specialty

## 2015-10-06 DIAGNOSIS — E2839 Other primary ovarian failure: Secondary | ICD-10-CM | POA: Diagnosis not present

## 2015-10-06 DIAGNOSIS — Z780 Asymptomatic menopausal state: Secondary | ICD-10-CM | POA: Diagnosis not present

## 2015-12-30 ENCOUNTER — Other Ambulatory Visit: Payer: Self-pay | Admitting: Unknown Physician Specialty

## 2015-12-30 DIAGNOSIS — Z1231 Encounter for screening mammogram for malignant neoplasm of breast: Secondary | ICD-10-CM

## 2016-01-13 ENCOUNTER — Ambulatory Visit
Admission: RE | Admit: 2016-01-13 | Discharge: 2016-01-13 | Disposition: A | Discharge status: Home / Self Care | Payer: Medicare Other | Source: Ambulatory Visit | Attending: Unknown Physician Specialty | Admitting: Unknown Physician Specialty

## 2016-01-13 ENCOUNTER — Other Ambulatory Visit: Payer: Self-pay | Admitting: Unknown Physician Specialty

## 2016-01-13 DIAGNOSIS — Z1231 Encounter for screening mammogram for malignant neoplasm of breast: Secondary | ICD-10-CM

## 2016-03-16 ENCOUNTER — Ambulatory Visit: Payer: Medicare Other | Admitting: Unknown Physician Specialty

## 2016-03-27 ENCOUNTER — Other Ambulatory Visit: Payer: Self-pay | Admitting: Unknown Physician Specialty

## 2016-03-29 ENCOUNTER — Ambulatory Visit: Payer: Medicare Other | Admitting: Unknown Physician Specialty

## 2016-04-01 DIAGNOSIS — I10 Essential (primary) hypertension: Secondary | ICD-10-CM | POA: Diagnosis not present

## 2016-04-01 DIAGNOSIS — E785 Hyperlipidemia, unspecified: Secondary | ICD-10-CM | POA: Diagnosis not present

## 2016-04-01 DIAGNOSIS — I340 Nonrheumatic mitral (valve) insufficiency: Secondary | ICD-10-CM | POA: Diagnosis not present

## 2016-04-13 ENCOUNTER — Ambulatory Visit (INDEPENDENT_AMBULATORY_CARE_PROVIDER_SITE_OTHER): Payer: Medicare Other | Admitting: Unknown Physician Specialty

## 2016-04-13 ENCOUNTER — Encounter: Payer: Self-pay | Admitting: Unknown Physician Specialty

## 2016-04-13 VITALS — BP 131/76 | HR 73 | Temp 98.6°F | Ht 66.5 in | Wt 211.2 lb

## 2016-04-13 DIAGNOSIS — E785 Hyperlipidemia, unspecified: Secondary | ICD-10-CM

## 2016-04-13 DIAGNOSIS — I10 Essential (primary) hypertension: Secondary | ICD-10-CM

## 2016-04-13 NOTE — Progress Notes (Signed)
BP 131/76 (BP Location: Left Arm, Patient Position: Sitting, Cuff Size: Large)   Pulse 73   Temp 98.6 F (37 C)   Ht 5' 6.5" (1.689 m) Comment: pt had shoes on  Wt 211 lb 3.2 oz (95.8 kg) Comment: pt had shoes on  LMP  (LMP Unknown)   SpO2 98%   BMI 33.58 kg/m    Subjective:    Patient ID: Denise Morris, female    DOB: June 06, 1946, 69 y.o.   MRN: [id number]  HPI: Denise Morris is a 70 y.o. female  Chief Complaint  Patient presents with  . Hyperlipidemia  . Hypertension   Hypertension Using medications without difficulty Average home BPs Like they are today or lower  No problems or lightheadedness No chest pain with exertion or shortness of breath No Edema   Hyperlipidemia Using medications without problems: No Muscle aches  Diet compliance: Cut out soft drinks and desserts Exercise: Walking    Relevant past medical, surgical, family and social history reviewed and updated as indicated. Interim medical history since our last visit reviewed. Allergies and medications reviewed and updated.  Review of Systems  Per HPI unless specifically indicated above     Objective:    BP 131/76 (BP Location: Left Arm, Patient Position: Sitting, Cuff Size: Large)   Pulse 73   Temp 98.6 F (37 C)   Ht 5' 6.5" (1.689 m) Comment: pt had shoes on  Wt 211 lb 3.2 oz (95.8 kg) Comment: pt had shoes on  LMP  (LMP Unknown)   SpO2 98%   BMI 33.58 kg/m   Wt Readings from Last 3 Encounters:  04/13/16 211 lb 3.2 oz (95.8 kg)  09/16/15 211 lb (95.7 kg)  02/12/15 202 lb 6.4 oz (91.8 kg)    Physical Exam  Constitutional: She is oriented to person, place, and time. She appears well-developed and well-nourished. No distress.  HENT:  Head: Normocephalic and atraumatic.  Eyes: Conjunctivae and lids are normal. Right eye exhibits no discharge. Left eye exhibits no discharge. No scleral icterus.  Neck: Normal range of motion. Neck supple. No JVD present. Carotid bruit is not present.    Cardiovascular: Normal rate, regular rhythm and normal heart sounds.   Pulmonary/Chest: Effort normal and breath sounds normal.  Abdominal: Normal appearance. There is no splenomegaly or hepatomegaly.  Musculoskeletal: Normal range of motion.  Neurological: She is alert and oriented to person, place, and time.  Skin: Skin is warm, dry and intact. No rash noted. No pallor.  Psychiatric: She has a normal mood and affect. Her behavior is normal. Judgment and thought content normal.    Results for orders placed or performed in visit on 09/16/15  Comprehensive metabolic panel  Result Value Ref Range   Glucose 102 (H) 65 - 99 mg/dL   BUN 15 8 - 27 mg/dL   Creatinine, Ser 0.90 0.57 - 1.00 mg/dL   GFR calc non Af Amer 65 >59 mL/min/1.73   GFR calc Af Amer 74 >59 mL/min/1.73   BUN/Creatinine Ratio 17 11 - 26   Sodium 142 134 - 144 mmol/L   Potassium 3.7 3.5 - 5.2 mmol/L   Chloride 99 96 - 106 mmol/L   CO2 24 18 - 29 mmol/L   Calcium 8.8 8.7 - 10.3 mg/dL   Total Protein 6.5 6.0 - 8.5 g/dL   Albumin 3.7 3.5 - 4.8 g/dL   Globulin, Total 2.8 1.5 - 4.5 g/dL   Albumin/Globulin Ratio 1.3 1.1 - 2.5  Bilirubin Total 0.6 0.0 - 1.2 mg/dL   Alkaline Phosphatase 82 39 - 117 IU/L   AST 17 0 - 40 IU/L   ALT 16 0 - 32 IU/L  Hepatitis C antibody  Result Value Ref Range   Hep C Virus Ab <0.1 0.0 - 0.9 s/co ratio      Assessment & Plan:   Problem List Items Addressed This Visit      Unprioritized   Hyperlipidemia - Primary   Relevant Orders   Lipid Panel w/o Chol/HDL Ratio   Hypertension   Relevant Orders   Comprehensive metabolic panel    Other Visit Diagnoses   None.      Follow up plan: Return in about 6 months (around 10/13/2016) for physical.

## 2016-04-14 ENCOUNTER — Encounter: Payer: Self-pay | Admitting: Family Medicine

## 2016-04-14 LAB — COMPREHENSIVE METABOLIC PANEL
ALBUMIN: 3.9 g/dL (ref 3.5–4.8)
ALK PHOS: 75 IU/L (ref 39–117)
ALT: 13 IU/L (ref 0–32)
AST: 14 IU/L (ref 0–40)
Albumin/Globulin Ratio: 1.5 (ref 1.2–2.2)
BUN / CREAT RATIO: 14 (ref 12–28)
BUN: 12 mg/dL (ref 8–27)
Bilirubin Total: 0.6 mg/dL (ref 0.0–1.2)
CO2: 27 mmol/L (ref 18–29)
CREATININE: 0.83 mg/dL (ref 0.57–1.00)
Calcium: 9.1 mg/dL (ref 8.7–10.3)
Chloride: 102 mmol/L (ref 96–106)
GFR calc non Af Amer: 71 mL/min/{1.73_m2} (ref >59)
GFR, EST AFRICAN AMERICAN: 81 mL/min/{1.73_m2} (ref >59)
GLOBULIN, TOTAL: 2.6 g/dL (ref 1.5–4.5)
GLUCOSE: 101 mg/dL — AB (ref 65–99)
Potassium: 3.8 mmol/L (ref 3.5–5.2)
SODIUM: 142 mmol/L (ref 134–144)
TOTAL PROTEIN: 6.5 g/dL (ref 6.0–8.5)

## 2016-04-14 LAB — LIPID PANEL W/O CHOL/HDL RATIO
CHOLESTEROL TOTAL: 137 mg/dL (ref 100–199)
HDL: 48 mg/dL (ref >39)
LDL CALC: 73 mg/dL (ref 0–99)
Triglycerides: 78 mg/dL (ref 0–149)
VLDL CHOLESTEROL CAL: 16 mg/dL (ref 5–40)

## 2016-08-23 DIAGNOSIS — R0602 Shortness of breath: Secondary | ICD-10-CM | POA: Diagnosis not present

## 2016-08-23 DIAGNOSIS — I340 Nonrheumatic mitral (valve) insufficiency: Secondary | ICD-10-CM | POA: Diagnosis not present

## 2016-08-23 DIAGNOSIS — I10 Essential (primary) hypertension: Secondary | ICD-10-CM | POA: Diagnosis not present

## 2016-08-23 DIAGNOSIS — R002 Palpitations: Secondary | ICD-10-CM | POA: Diagnosis not present

## 2016-08-23 DIAGNOSIS — E785 Hyperlipidemia, unspecified: Secondary | ICD-10-CM | POA: Diagnosis not present

## 2016-08-23 DIAGNOSIS — R079 Chest pain, unspecified: Secondary | ICD-10-CM | POA: Diagnosis not present

## 2016-09-07 DIAGNOSIS — R079 Chest pain, unspecified: Secondary | ICD-10-CM | POA: Diagnosis not present

## 2016-09-09 DIAGNOSIS — R002 Palpitations: Secondary | ICD-10-CM | POA: Diagnosis not present

## 2016-09-09 DIAGNOSIS — I10 Essential (primary) hypertension: Secondary | ICD-10-CM | POA: Diagnosis not present

## 2016-09-20 ENCOUNTER — Other Ambulatory Visit: Payer: Self-pay | Admitting: Unknown Physician Specialty

## 2016-10-18 ENCOUNTER — Encounter: Payer: Medicare Other | Admitting: Unknown Physician Specialty

## 2016-10-19 ENCOUNTER — Ambulatory Visit (INDEPENDENT_AMBULATORY_CARE_PROVIDER_SITE_OTHER): Payer: Medicare Other | Admitting: Unknown Physician Specialty

## 2016-10-19 ENCOUNTER — Encounter: Payer: Self-pay | Admitting: Unknown Physician Specialty

## 2016-10-19 VITALS — BP 113/70 | HR 67 | Temp 98.7°F | Ht 65.6 in | Wt 211.6 lb

## 2016-10-19 DIAGNOSIS — Z1231 Encounter for screening mammogram for malignant neoplasm of breast: Secondary | ICD-10-CM

## 2016-10-19 DIAGNOSIS — Z7189 Other specified counseling: Secondary | ICD-10-CM | POA: Diagnosis not present

## 2016-10-19 DIAGNOSIS — R7301 Impaired fasting glucose: Secondary | ICD-10-CM

## 2016-10-19 DIAGNOSIS — I10 Essential (primary) hypertension: Secondary | ICD-10-CM

## 2016-10-19 DIAGNOSIS — E782 Mixed hyperlipidemia: Secondary | ICD-10-CM | POA: Diagnosis not present

## 2016-10-19 DIAGNOSIS — Z0000 Encounter for general adult medical examination without abnormal findings: Secondary | ICD-10-CM

## 2016-10-19 NOTE — Assessment & Plan Note (Signed)
Stable, continue present medications.   

## 2016-10-19 NOTE — Progress Notes (Signed)
BP 113/70 (BP Location: Left Arm, Cuff Size: Large)   Pulse 67   Temp 98.7 F (37.1 C)   Ht 5' 5.6" (1.666 m)   Wt 211 lb 9.6 oz (96 kg)   LMP  (LMP Unknown)   SpO2 98%   BMI 34.57 kg/m    Subjective:    Patient ID: Denise Morris, female    DOB: 02/27/1946, 71 y.o.   MRN: [id number]  HPI: Denise Morris is a 71 y.o. female  Chief Complaint  Patient presents with  . Medicare Wellness   Functional Status Survey: Is the patient deaf or have difficulty hearing?: No Does the patient have difficulty seeing, even when wearing glasses/contacts?: No Does the patient have difficulty concentrating, remembering, or making decisions?: Yes Does the patient have difficulty walking or climbing stairs?: No Does the patient have difficulty dressing or bathing?: No Does the patient have difficulty doing errands alone such as visiting a doctor's office or shopping?: No  Depression screen [facility] 2/9 10/19/2016 09/16/2015  Decreased Interest 0 0  Down, Depressed, Hopeless 0 0  PHQ - 2 Score 0 0  Altered sleeping 1 -  Tired, decreased energy 0 -  Change in appetite 0 -  Feeling bad or failure about yourself  0 -  Trouble concentrating 0 -  Moving slowly or fidgety/restless 0 -  Suicidal thoughts 0 -  PHQ-9 Score 1 -   Fall Risk  10/19/2016 09/16/2015  Falls in the past year? No Yes  Number falls in past yr: - 1  Injury with Fall? - No   Hypertension Using medications without difficulty Average home BPs it's fine   No problems or lightheadedness No chest pain with exertion or shortness of breath No Edema  Hyperlipidemia Using medications without problems: No Muscle aches  Diet compliance/Exercise: Walking in the park  Relevant past medical, surgical, family and social history reviewed and updated as indicated. Interim medical history since our last visit reviewed. Allergies and medications reviewed and updated.  Review of Systems  Constitutional: Negative.   HENT: Negative.     Eyes: Negative.   Respiratory: Negative.   Cardiovascular: Negative.   Gastrointestinal: Negative.   Endocrine: Negative.   Genitourinary: Negative.   Musculoskeletal: Negative.   Skin: Negative.   Allergic/Immunologic: Negative.   Neurological: Negative.   Hematological: Negative.   Psychiatric/Behavioral: Negative.     Per HPI unless specifically indicated above     Objective:    BP 113/70 (BP Location: Left Arm, Cuff Size: Large)   Pulse 67   Temp 98.7 F (37.1 C)   Ht 5' 5.6" (1.666 m)   Wt 211 lb 9.6 oz (96 kg)   LMP  (LMP Unknown)   SpO2 98%   BMI 34.57 kg/m   Wt Readings from Last 3 Encounters:  10/19/16 211 lb 9.6 oz (96 kg)  04/13/16 211 lb 3.2 oz (95.8 kg)  09/16/15 211 lb (95.7 kg)    Physical Exam  Constitutional: She is oriented to person, place, and time. She appears well-developed and well-nourished. No distress.  HENT:  Head: Normocephalic and atraumatic.  Eyes: Conjunctivae and lids are normal. Pupils are equal, round, and reactive to light. Right eye exhibits no discharge. Left eye exhibits no discharge. No scleral icterus.  Neck: Normal range of motion. Neck supple. No JVD present. Carotid bruit is not present. No thyromegaly present.  Cardiovascular: Normal rate, regular rhythm and normal heart sounds.  Exam reveals no gallop and no friction rub.  No murmur heard. Pulmonary/Chest: Effort normal and breath sounds normal. No respiratory distress. She has no wheezes. She has no rales.  Abdominal: Soft. Normal appearance and bowel sounds are normal. There is no splenomegaly or hepatomegaly. There is no tenderness. There is no rebound.  Genitourinary: No breast swelling, tenderness or discharge.  Musculoskeletal: Normal range of motion.  Lymphadenopathy:    She has no cervical adenopathy.  Neurological: She is alert and oriented to person, place, and time.  Skin: Skin is warm, dry and intact. No rash noted. No pallor.  Psychiatric: She has a normal  mood and affect. Her speech is normal and behavior is normal. Judgment and thought content normal. Cognition and memory are normal.    Results for orders placed or performed in visit on 04/13/16  Comprehensive metabolic panel  Result Value Ref Range   Glucose 101 (H) 65 - 99 mg/dL   BUN 12 8 - 27 mg/dL   Creatinine, Ser 0.83 0.57 - 1.00 mg/dL   GFR calc non Af Amer 71 >59 mL/min/1.73   GFR calc Af Amer 81 >59 mL/min/1.73   BUN/Creatinine Ratio 14 12 - 28   Sodium 142 134 - 144 mmol/L   Potassium 3.8 3.5 - 5.2 mmol/L   Chloride 102 96 - 106 mmol/L   CO2 27 18 - 29 mmol/L   Calcium 9.1 8.7 - 10.3 mg/dL   Total Protein 6.5 6.0 - 8.5 g/dL   Albumin 3.9 3.5 - 4.8 g/dL   Globulin, Total 2.6 1.5 - 4.5 g/dL   Albumin/Globulin Ratio 1.5 1.2 - 2.2   Bilirubin Total 0.6 0.0 - 1.2 mg/dL   Alkaline Phosphatase 75 39 - 117 IU/L   AST 14 0 - 40 IU/L   ALT 13 0 - 32 IU/L  Lipid Panel w/o Chol/HDL Ratio  Result Value Ref Range   Cholesterol, Total 137 100 - 199 mg/dL   Triglycerides 78 0 - 149 mg/dL   HDL 48 >39 mg/dL   VLDL Cholesterol Cal 16 5 - 40 mg/dL   LDL Calculated 73 0 - 99 mg/dL      Assessment & Plan:   Problem List Items Addressed This Visit      Unprioritized   Advanced care planning/counseling discussion    Brief discussion.  Pt has advance directives and does not want to make changes.  Will bring in forms to be scanned in the chart      Hyperlipidemia    Stable, continue present medications.        Relevant Orders   Lipid Panel w/o Chol/HDL Ratio   Hypertension    Stable, continue present medications.        Relevant Orders   Comprehensive metabolic panel   Impaired fasting glucose    Check Hgb A1C      Relevant Orders   Hgb A1c w/o eAG    Other Visit Diagnoses    Annual physical exam    -  Primary   Encounter for screening mammogram for breast cancer       Relevant Orders   MM DIGITAL SCREENING BILATERAL       Follow up plan: Return in about 6  months (around 04/21/2017).

## 2016-10-19 NOTE — Assessment & Plan Note (Signed)
Brief discussion.  Pt has advance directives and does not want to make changes.  Will bring in forms to be scanned in the chart

## 2016-10-19 NOTE — Assessment & Plan Note (Signed)
Check Hgb A1C 

## 2016-10-19 NOTE — Patient Instructions (Signed)
Please do call to schedule your mammogram; the number to schedule one at either [facility] or [facility] is [phone]   

## 2016-10-20 LAB — COMPREHENSIVE METABOLIC PANEL
A/G RATIO: 1.5 (ref 1.2–2.2)
ALT: 13 IU/L (ref 0–32)
AST: 15 IU/L (ref 0–40)
Albumin: 3.9 g/dL (ref 3.5–4.8)
Alkaline Phosphatase: 79 IU/L (ref 39–117)
BILIRUBIN TOTAL: 0.4 mg/dL (ref 0.0–1.2)
BUN/Creatinine Ratio: 19 (ref 12–28)
BUN: 16 mg/dL (ref 8–27)
CHLORIDE: 101 mmol/L (ref 96–106)
CO2: 28 mmol/L (ref 18–29)
Calcium: 9.2 mg/dL (ref 8.7–10.3)
Creatinine, Ser: 0.86 mg/dL (ref 0.57–1.00)
GFR calc non Af Amer: 68 mL/min/{1.73_m2} (ref >59)
GFR, EST AFRICAN AMERICAN: 78 mL/min/{1.73_m2} (ref >59)
Globulin, Total: 2.6 g/dL (ref 1.5–4.5)
Glucose: 106 mg/dL — ABNORMAL HIGH (ref 65–99)
POTASSIUM: 3.9 mmol/L (ref 3.5–5.2)
Sodium: 144 mmol/L (ref 134–144)
TOTAL PROTEIN: 6.5 g/dL (ref 6.0–8.5)

## 2016-10-20 LAB — LIPID PANEL W/O CHOL/HDL RATIO
Cholesterol, Total: 155 mg/dL (ref 100–199)
HDL: 50 mg/dL (ref >39)
LDL Calculated: 90 mg/dL (ref 0–99)
Triglycerides: 73 mg/dL (ref 0–149)
VLDL Cholesterol Cal: 15 mg/dL (ref 5–40)

## 2016-10-20 LAB — HGB A1C W/O EAG: Hgb A1c MFr Bld: 6.3 % — ABNORMAL HIGH (ref 4.8–5.6)

## 2017-01-07 DIAGNOSIS — I10 Essential (primary) hypertension: Secondary | ICD-10-CM | POA: Diagnosis not present

## 2017-01-07 DIAGNOSIS — R002 Palpitations: Secondary | ICD-10-CM | POA: Diagnosis not present

## 2017-01-07 DIAGNOSIS — E785 Hyperlipidemia, unspecified: Secondary | ICD-10-CM | POA: Diagnosis not present

## 2017-01-13 ENCOUNTER — Ambulatory Visit
Admission: RE | Admit: 2017-01-13 | Discharge: 2017-01-13 | Disposition: A | Discharge status: Home / Self Care | Payer: Medicare Other | Source: Ambulatory Visit | Attending: Unknown Physician Specialty | Admitting: Unknown Physician Specialty

## 2017-01-13 DIAGNOSIS — Z1231 Encounter for screening mammogram for malignant neoplasm of breast: Secondary | ICD-10-CM | POA: Diagnosis not present

## 2017-03-02 DIAGNOSIS — H524 Presbyopia: Secondary | ICD-10-CM | POA: Diagnosis not present

## 2017-03-02 DIAGNOSIS — H5203 Hypermetropia, bilateral: Secondary | ICD-10-CM | POA: Diagnosis not present

## 2017-03-02 DIAGNOSIS — H2513 Age-related nuclear cataract, bilateral: Secondary | ICD-10-CM | POA: Diagnosis not present

## 2017-03-02 DIAGNOSIS — H52223 Regular astigmatism, bilateral: Secondary | ICD-10-CM | POA: Diagnosis not present

## 2017-03-02 DIAGNOSIS — I10 Essential (primary) hypertension: Secondary | ICD-10-CM | POA: Diagnosis not present

## 2017-03-15 ENCOUNTER — Other Ambulatory Visit: Payer: Self-pay | Admitting: Unknown Physician Specialty

## 2017-04-25 ENCOUNTER — Ambulatory Visit: Payer: Medicare Other | Admitting: Unknown Physician Specialty

## 2017-05-20 ENCOUNTER — Encounter: Payer: Self-pay | Admitting: Unknown Physician Specialty

## 2017-05-20 ENCOUNTER — Ambulatory Visit (INDEPENDENT_AMBULATORY_CARE_PROVIDER_SITE_OTHER): Payer: Medicare Other | Admitting: Unknown Physician Specialty

## 2017-05-20 VITALS — BP 133/72 | HR 71 | Temp 98.7°F | Ht 66.6 in | Wt 208.8 lb

## 2017-05-20 DIAGNOSIS — I10 Essential (primary) hypertension: Secondary | ICD-10-CM | POA: Diagnosis not present

## 2017-05-20 DIAGNOSIS — R7301 Impaired fasting glucose: Secondary | ICD-10-CM | POA: Diagnosis not present

## 2017-05-20 DIAGNOSIS — E782 Mixed hyperlipidemia: Secondary | ICD-10-CM

## 2017-05-20 LAB — BAYER DCA HB A1C WAIVED: HB A1C (BAYER DCA - WAIVED): 6.1 % (ref <7.0)

## 2017-05-20 MED ORDER — FLUOCINONIDE 0.05 % EX CREA: TOPICAL_CREAM | Freq: Two times a day (BID) | CUTANEOUS | 2 refills | Status: DC | PRN

## 2017-05-20 MED ORDER — METOPROLOL SUCCINATE ER 50 MG PO TB24: 50.0000 mg | ORAL_TABLET | Freq: Every day | ORAL | 1 refills | Status: DC

## 2017-05-20 MED ORDER — LOSARTAN POTASSIUM-HCTZ 100-25 MG PO TABS: 1.0000 | ORAL_TABLET | Freq: Every day | ORAL | 1 refills | Status: DC

## 2017-05-20 NOTE — Assessment & Plan Note (Signed)
Stable, continue present medications.   

## 2017-05-20 NOTE — Assessment & Plan Note (Signed)
Hgb A1C is better is 6.1%.  This has improved

## 2017-05-20 NOTE — Progress Notes (Signed)
BP 133/72   Pulse 71   Temp 98.7 F (37.1 C)   Ht 5' 6.6" (1.692 m)   Wt 208 lb 12.8 oz (94.7 kg)   LMP  (LMP Unknown)   SpO2 96%   BMI 33.10 kg/m    Subjective:    Patient ID: Denise Morris, female    DOB: [birth year], 71 y.o.   MRN: [id number]  HPI: Denise Morris is a 71 y.o. female  Chief Complaint  Patient presents with  . Hyperlipidemia  . Hypertension   Hypertension Using medications without difficulty Average home BPs About the same as here   No problems or lightheadedness No chest pain with exertion or shortness of breath No Edema  Hyperlipidemia Using medications without problems: No Muscle aches  Diet compliance:Exercise: States she is doing well.  Plans to walk when it cools down.    Relevant past medical, surgical, family and social history reviewed and updated as indicated. Interim medical history since our last visit reviewed. Allergies and medications reviewed and updated.  Review of Systems  Per HPI unless specifically indicated above     Objective:    BP 133/72   Pulse 71   Temp 98.7 F (37.1 C)   Ht 5' 6.6" (1.692 m)   Wt 208 lb 12.8 oz (94.7 kg)   LMP  (LMP Unknown)   SpO2 96%   BMI 33.10 kg/m   Wt Readings from Last 3 Encounters:  05/20/17 208 lb 12.8 oz (94.7 kg)  10/19/16 211 lb 9.6 oz (96 kg)  04/13/16 211 lb 3.2 oz (95.8 kg)    Physical Exam  Constitutional: She is oriented to person, place, and time. She appears well-developed and well-nourished. No distress.  HENT:  Head: Normocephalic and atraumatic.  Eyes: Conjunctivae and lids are normal. Right eye exhibits no discharge. Left eye exhibits no discharge. No scleral icterus.  Neck: Normal range of motion. Neck supple. No JVD present. Carotid bruit is not present.  Cardiovascular: Normal rate, regular rhythm and normal heart sounds.   Pulmonary/Chest: Effort normal and breath sounds normal.  Abdominal: Normal appearance. There is no splenomegaly or hepatomegaly.    Musculoskeletal: Normal range of motion.  Neurological: She is alert and oriented to person, place, and time.  Skin: Skin is warm, dry and intact. No rash noted. No pallor.  Psychiatric: She has a normal mood and affect. Her behavior is normal. Judgment and thought content normal.    Results for orders placed or performed in visit on 10/19/16  Comprehensive metabolic panel  Result Value Ref Range   Glucose 106 (H) 65 - 99 mg/dL   BUN 16 8 - 27 mg/dL   Creatinine, Ser 0.86 0.57 - 1.00 mg/dL   GFR calc non Af Amer 68 >59 mL/min/1.73   GFR calc Af Amer 78 >59 mL/min/1.73   BUN/Creatinine Ratio 19 12 - 28   Sodium 144 134 - 144 mmol/L   Potassium 3.9 3.5 - 5.2 mmol/L   Chloride 101 96 - 106 mmol/L   CO2 28 18 - 29 mmol/L   Calcium 9.2 8.7 - 10.3 mg/dL   Total Protein 6.5 6.0 - 8.5 g/dL   Albumin 3.9 3.5 - 4.8 g/dL   Globulin, Total 2.6 1.5 - 4.5 g/dL   Albumin/Globulin Ratio 1.5 1.2 - 2.2   Bilirubin Total 0.4 0.0 - 1.2 mg/dL   Alkaline Phosphatase 79 39 - 117 IU/L   AST 15 0 - 40 IU/L   ALT 13 0 -  32 IU/L  Lipid Panel w/o Chol/HDL Ratio  Result Value Ref Range   Cholesterol, Total 155 100 - 199 mg/dL   Triglycerides 73 0 - 149 mg/dL   HDL 50 >39 mg/dL   VLDL Cholesterol Cal 15 5 - 40 mg/dL   LDL Calculated 90 0 - 99 mg/dL  Hgb A1c w/o eAG  Result Value Ref Range   Hgb A1c MFr Bld 6.3 (H) 4.8 - 5.6 %      Assessment & Plan:   Problem List Items Addressed This Visit      Unprioritized   Hyperlipidemia    Stable, continue present medications.        Relevant Medications   losartan-hydrochlorothiazide (HYZAAR) 100-25 MG tablet   metoprolol succinate (TOPROL-XL) 50 MG 24 hr tablet   Other Relevant Orders   Lipid Panel w/o Chol/HDL Ratio   Hypertension - Primary    Stable, continue present medications.        Relevant Medications   losartan-hydrochlorothiazide (HYZAAR) 100-25 MG tablet   metoprolol succinate (TOPROL-XL) 50 MG 24 hr tablet   Other Relevant  Orders   Comprehensive metabolic panel   Impaired fasting glucose    Hgb A1C is better is 6.1%.  This has improved      Relevant Orders   Bayer DCA Hb A1c Waived       Follow up plan: Return in about 6 months (around 11/18/2017).

## 2017-05-21 LAB — COMPREHENSIVE METABOLIC PANEL
A/G RATIO: 1.4 (ref 1.2–2.2)
ALT: 14 IU/L (ref 0–32)
AST: 17 IU/L (ref 0–40)
Albumin: 3.9 g/dL (ref 3.5–4.8)
Alkaline Phosphatase: 73 IU/L (ref 39–117)
BILIRUBIN TOTAL: 0.6 mg/dL (ref 0.0–1.2)
BUN/Creatinine Ratio: 19 (ref 12–28)
BUN: 14 mg/dL (ref 8–27)
CALCIUM: 9 mg/dL (ref 8.7–10.3)
CHLORIDE: 105 mmol/L (ref 96–106)
CO2: 23 mmol/L (ref 20–29)
Creatinine, Ser: 0.73 mg/dL (ref 0.57–1.00)
GFR, EST AFRICAN AMERICAN: 94 mL/min/{1.73_m2} (ref >59)
GFR, EST NON AFRICAN AMERICAN: 82 mL/min/{1.73_m2} (ref >59)
GLOBULIN, TOTAL: 2.7 g/dL (ref 1.5–4.5)
Glucose: 106 mg/dL — ABNORMAL HIGH (ref 65–99)
POTASSIUM: 3.4 mmol/L — AB (ref 3.5–5.2)
SODIUM: 142 mmol/L (ref 134–144)
TOTAL PROTEIN: 6.6 g/dL (ref 6.0–8.5)

## 2017-05-21 LAB — LIPID PANEL W/O CHOL/HDL RATIO
Cholesterol, Total: 134 mg/dL (ref 100–199)
HDL: 46 mg/dL (ref >39)
LDL Calculated: 75 mg/dL (ref 0–99)
Triglycerides: 65 mg/dL (ref 0–149)
VLDL Cholesterol Cal: 13 mg/dL (ref 5–40)

## 2017-05-23 NOTE — Progress Notes (Signed)
Normal labs.  Pt notified through mychart

## 2017-05-27 DIAGNOSIS — E785 Hyperlipidemia, unspecified: Secondary | ICD-10-CM | POA: Diagnosis not present

## 2017-05-27 DIAGNOSIS — I10 Essential (primary) hypertension: Secondary | ICD-10-CM | POA: Diagnosis not present

## 2017-05-27 DIAGNOSIS — R0602 Shortness of breath: Secondary | ICD-10-CM | POA: Diagnosis not present

## 2017-05-27 DIAGNOSIS — R002 Palpitations: Secondary | ICD-10-CM | POA: Diagnosis not present

## 2017-09-08 ENCOUNTER — Other Ambulatory Visit: Payer: Self-pay | Admitting: Unknown Physician Specialty

## 2017-10-25 DIAGNOSIS — R002 Palpitations: Secondary | ICD-10-CM | POA: Diagnosis not present

## 2017-10-25 DIAGNOSIS — I10 Essential (primary) hypertension: Secondary | ICD-10-CM | POA: Diagnosis not present

## 2017-10-25 DIAGNOSIS — R0602 Shortness of breath: Secondary | ICD-10-CM | POA: Diagnosis not present

## 2017-10-25 DIAGNOSIS — R601 Generalized edema: Secondary | ICD-10-CM | POA: Diagnosis not present

## 2017-10-25 DIAGNOSIS — I349 Nonrheumatic mitral valve disorder, unspecified: Secondary | ICD-10-CM | POA: Diagnosis not present

## 2017-10-25 DIAGNOSIS — K210 Gastro-esophageal reflux disease with esophagitis: Secondary | ICD-10-CM | POA: Diagnosis not present

## 2017-10-25 DIAGNOSIS — R079 Chest pain, unspecified: Secondary | ICD-10-CM | POA: Diagnosis not present

## 2017-10-25 DIAGNOSIS — E785 Hyperlipidemia, unspecified: Secondary | ICD-10-CM | POA: Diagnosis not present

## 2017-11-09 ENCOUNTER — Ambulatory Visit (INDEPENDENT_AMBULATORY_CARE_PROVIDER_SITE_OTHER): Payer: Medicare Other

## 2017-11-09 VITALS — BP 127/76 | HR 73 | Temp 97.6°F | Resp 16 | Ht 66.0 in | Wt 212.5 lb

## 2017-11-09 DIAGNOSIS — Z0000 Encounter for general adult medical examination without abnormal findings: Secondary | ICD-10-CM

## 2017-11-09 DIAGNOSIS — Z1231 Encounter for screening mammogram for malignant neoplasm of breast: Secondary | ICD-10-CM

## 2017-11-09 DIAGNOSIS — Z1239 Encounter for other screening for malignant neoplasm of breast: Secondary | ICD-10-CM

## 2017-11-09 NOTE — Progress Notes (Signed)
Subjective:   Denise Morris is a 72 y.o. female who presents for Medicare Annual (Subsequent) preventive examination.  Review of Systems:   Cardiac Risk Factors include: hypertension;advanced age (>57men, >86 women);dyslipidemia;obesity (BMI >30kg/m2)     Objective:     Vitals: BP 127/76 (BP Location: Left Arm, Patient Position: Sitting)   Pulse 73   Temp 97.6 F (36.4 C) (Temporal)   Resp 16   Ht 5\' 6"  (1.676 m)   Wt 212 lb 8 oz (96.4 kg)   LMP  (LMP Unknown)   BMI 34.30 kg/m   Body mass index is 34.3 kg/m.  Advanced Directives 11/09/2017 10/19/2016 10/19/2016 09/16/2015 09/16/2015  Does Patient Have a Medical Advance Directive? Yes - Yes Yes Yes  Type of Paramedic of [place];Living will - Living will;Healthcare Power of [place];Living will [place];Living will  Does patient want to make changes to medical advance directive? - - - No - Patient declined -  Copy of [place] in Chart? No - copy requested No - copy requested Yes No - copy requested -    Tobacco Social History   Tobacco Use  Smoking Status Never Smoker  Smokeless Tobacco Never Used     Counseling given: Not Answered   Clinical Intake:  Pre-visit preparation completed: Yes  Pain : No/denies pain     Nutritional Status: BMI > 30  Obese Nutritional Risks: None Diabetes: No  How often do you need to have someone help you when you read instructions, pamphlets, or other written materials from your doctor or pharmacy?: 1 - Never What is the last grade level you completed in school?: masters degree  Interpreter Needed?: No  Information entered by :: Denise Raether,LPN   Past Medical History:  Diagnosis Date  . Anxiety   . Depression   . Edema   . Heart palpitations   . Hyperlipidemia   . Hypertension   . Menopausal state   . Osteopenia    Past Surgical History:  Procedure Laterality Date  . ABDOMINAL  HYSTERECTOMY  1978   Partial ( Precancerous cells)   Family History  Problem Relation Age of Onset  . Alzheimer's disease Mother   . Hypertension Father   . Hypertension Sister   . Hyperlipidemia Sister    Social History   Socioeconomic History  . Marital status: Married    Spouse name: Not on file  . Number of children: Not on file  . Years of education: Not on file  . Highest education level: Not on file  Occupational History  . Not on file  Social Needs  . Financial resource strain: Not hard at all  . Food insecurity:    Worry: Never true    Inability: Never true  . Transportation needs:    Medical: No    Non-medical: No  Tobacco Use  . Smoking status: Never Smoker  . Smokeless tobacco: Never Used  Substance and Sexual Activity  . Alcohol use: No  . Drug use: No  . Sexual activity: Yes  Lifestyle  . Physical activity:    Days per week: 0 days    Minutes per session: 0 min  . Stress: Not at all  Relationships  . Social connections:    Talks on phone: More than three times a week    Gets together: More than three times a week    Attends religious service: More than 4 times per year  Active member of club or organization: Yes    Attends meetings of clubs or organizations: More than 4 times per year    Relationship status: Married  Other Topics Concern  . Not on file  Social History Narrative   [employer]- meet monthly   [employer]- meet monthly    Outpatient Encounter Medications as of 11/09/2017  Medication Sig  . aspirin 81 MG tablet Take 81 mg by mouth daily.  . Calcium Carbonate-Vitamin D (CALCIUM HIGH POTENCY/VITAMIN D) 600-200 MG-UNIT TABS Take by mouth.  . fluocinonide cream (LIDEX) 0.05 % Apply topically 2 (two) times daily as needed.  Marland Kitchen losartan-hydrochlorothiazide (HYZAAR) 100-25 MG tablet Take 1 tablet by mouth daily.  . metoprolol succinate (TOPROL-XL) 50 MG 24 hr tablet Take 1 tablet (50 mg total) by mouth daily. Take with or immediately following a  meal.  . Multiple Vitamin (MULTIVITAMIN) tablet Take by mouth.  . pantoprazole (PROTONIX) 20 MG tablet Take 20 mg by mouth daily.  . pravastatin (PRAVACHOL) 40 MG tablet TAKE 1 TABLET (40 MG TOTAL) BY MOUTH DAILY.   No facility-administered encounter medications on file as of 11/09/2017.     Activities of Daily Living In your present state of health, do you have any difficulty performing the following activities: 11/09/2017  Hearing? N  Vision? N  Difficulty concentrating or making decisions? N  Walking or climbing stairs? N  Dressing or bathing? N  Doing errands, shopping? N  Preparing Food and eating ? N  Using the Toilet? N  In the past six months, have you accidently leaked urine? N  Do you have problems with loss of bowel control? N  Managing your Medications? N  Managing your Finances? N  Housekeeping or managing your Housekeeping? N  Some recent data might be hidden    Patient Care Team: Kathrine Haddock, NP as PCP - General (Nurse Practitioner) Dionisio David, MD as Consulting Physician (Cardiology) Idelle Leech, OD (Optometry)    Assessment:   This is a routine wellness examination for Denise Morris.  Exercise Activities and Dietary recommendations Current Exercise Habits: The patient does not participate in regular exercise at present, Exercise limited by: None identified  Goals    . DIET - INCREASE WATER INTAKE     Recommend drinking at least 6-8 glasses of water a day        Fall Risk Fall Risk  11/09/2017 10/19/2016 09/16/2015  Falls in the past year? No No Yes  Number falls in past yr: - - 1  Injury with Fall? - - No   Is the patient's home free of loose throw rugs in walkways, pet beds, electrical cords, etc?   yes      Grab bars in the bathroom? yes      Handrails on the stairs?   yes      Adequate lighting?   yes  Timed Get Up and Go performed: Completed in 8 seconds with no use of assistive devices, steady gait. No intervention needed at this time.    Depression Screen PHQ 2/9 Scores 11/09/2017 10/19/2016 09/16/2015  PHQ - 2 Score 0 0 0  PHQ- 9 Score - 1 -     Cognitive Function     6CIT Screen 11/09/2017  What Year? 0 points  What month? 0 points  What time? 0 points  Count back from 20 0 points  Months in reverse 0 points  Repeat phrase 0 points  Total Score 0    Immunization History  Administered Date(s)  Administered  . Pneumococcal Conjugate-13 08/13/2014  . Pneumococcal Polysaccharide-23 04/06/2011  . Td 03/20/2003  . Zoster 04/04/2007    Qualifies for Shingles Vaccine?yes, discussed shingrix vaccine   Screening Tests Health Maintenance  Topic Date Due  . TETANUS/TDAP  03/19/2013  . INFLUENZA VACCINE  11/13/2017 (Originally 03/16/2017)  . MAMMOGRAM  01/14/2019  . COLONOSCOPY  10/24/2019  . DEXA SCAN  Completed  . Hepatitis C Screening  Completed  . PNA vac Low Risk Adult  Completed    Cancer Screenings: Lung: Low Dose CT Chest recommended if Age 61-80 years, 30 pack-year currently smoking OR have quit w/in 15years. Patient does not qualify. Breast:  Up to date on Mammogram? Yes   Up to date of Bone Density/Dexa? Yes Colorectal: completed 10/24/2014  Additional Screenings:  Hepatitis B/HIV/Syphillis: not indicated  Hepatitis C Screening: completed 09/16/2015     Plan:    I have personally reviewed and addressed the Medicare Annual Wellness questionnaire and have noted the following in the patient's chart:  A. Medical and social history B. Use of alcohol, tobacco or illicit drugs  C. Current medications and supplements D. Functional ability and status E.  Nutritional status F.  Physical activity G. Advance directives H. List of other physicians I.  Hospitalizations, surgeries, and ER visits in previous 12 months J.  [place] such as hearing and vision if needed, cognitive and depression L. Referrals and appointments   In addition, I have reviewed and discussed with patient certain  preventive protocols, quality metrics, and best practice recommendations. A written personalized care plan for preventive services as well as general preventive health recommendations were provided to patient.   Signed,  Tyler Aas, LPN Nurse Health Advisor   Nurse Notes:none

## 2017-11-09 NOTE — Patient Instructions (Addendum)
[name] , Thank you for taking time to come for your Medicare Wellness Visit. I appreciate your ongoing commitment to your health goals. Please review the following plan we discussed and let me know if I can assist you in the future.   Screening recommendations/referrals: Colonoscopy: completed 10/24/2014 Mammogram: completed 01/13/2017 Please call [phone] to schedule your mammogram. Bone Density: completed 10/06/2015 Recommended yearly ophthalmology/optometry visit for glaucoma screening and checkup Recommended yearly dental visit for hygiene and checkup  Vaccinations: Influenza vaccine: declined Pneumococcal vaccine: up to date Tdap vaccine: due, check with your insurance company for coverage  Shingles vaccine: shingrix eligible, check with your insurance company for coverage     Advanced directives: Please bring a copy of your health care power of attorney and living will to the office at your convenience.  Conditions/risks identified: recommend drinking at least 6-8 glasses of water a day   Next appointment: Follow up on 11/16/2017 at 10:00 am with Regino Schultze. Follow up in one year for your annual wellness exam.    Preventive Care 65 Years and Older, Female Preventive care refers to lifestyle choices and visits with your health care provider that can promote health and wellness. What does preventive care include?  A yearly physical exam. This is also called an annual well check.  Dental exams once or twice a year.  Routine eye exams. Ask your health care provider how often you should have your eyes checked.  Personal lifestyle choices, including:  Daily care of your teeth and gums.  Regular physical activity.  Eating a healthy diet.  Avoiding tobacco and drug use.  Limiting alcohol use.  Practicing safe sex.  Taking low-dose aspirin every day.  Taking vitamin and mineral supplements as recommended by your health care provider. What happens during an  annual well check? The services and screenings done by your health care provider during your annual well check will depend on your age, overall health, lifestyle risk factors, and family history of disease. Counseling  Your health care provider may ask you questions about your:  Alcohol use.  Tobacco use.  Drug use.  Emotional well-being.  Home and relationship well-being.  Sexual activity.  Eating habits.  History of falls.  Memory and ability to understand (cognition).  Work and work Statistician.  Reproductive health. Screening  You may have the following tests or measurements:  Height, weight, and BMI.  Blood pressure.  Lipid and cholesterol levels. These may be checked every 5 years, or more frequently if you are over 60 years old.  Skin check.  Lung cancer screening. You may have this screening every year starting at age 22 if you have a 30-pack-year history of smoking and currently smoke or have quit within the past 15 years.  Fecal occult blood test (FOBT) of the stool. You may have this test every year starting at age 19.  Flexible sigmoidoscopy or colonoscopy. You may have a sigmoidoscopy every 5 years or a colonoscopy every 10 years starting at age 69.  Hepatitis C blood test.  Hepatitis B blood test.  Sexually transmitted disease (STD) testing.  Diabetes screening. This is done by checking your blood sugar (glucose) after you have not eaten for a while (fasting). You may have this done every 1-3 years.  Bone density scan. This is done to screen for osteoporosis. You may have this done starting at age 109.  Mammogram. This may be done every 1-2 years. Talk to your health care provider about how often you should  have regular mammograms. Talk with your health care provider about your test results, treatment options, and if necessary, the need for more tests. Vaccines  Your health care provider may recommend certain vaccines, such as:  Influenza  vaccine. This is recommended every year.  Tetanus, diphtheria, and acellular pertussis (Tdap, Td) vaccine. You may need a Td booster every 10 years.  Zoster vaccine. You may need this after age 62.  Pneumococcal 13-valent conjugate (PCV13) vaccine. One dose is recommended after age 60.  Pneumococcal polysaccharide (PPSV23) vaccine. One dose is recommended after age 27. Talk to your health care provider about which screenings and vaccines you need and how often you need them. This information is not intended to replace advice given to you by your health care provider. Make sure you discuss any questions you have with your health care provider. Document Released: 08/29/2015 Document Revised: 04/21/2016 Document Reviewed: 06/03/2015 Elsevier Interactive Patient Education  2017 [place] Prevention in the Home Falls can cause injuries. They can happen to people of all ages. There are many things you can do to make your home safe and to help prevent falls. What can I do on the outside of my home?  Regularly fix the edges of walkways and driveways and fix any cracks.  Remove anything that might make you trip as you walk through a door, such as a raised step or threshold.  Trim any bushes or trees on the path to your home.  Use bright outdoor lighting.  Clear any walking paths of anything that might make someone trip, such as rocks or tools.  Regularly check to see if handrails are loose or broken. Make sure that both sides of any steps have handrails.  Any raised decks and porches should have guardrails on the edges.  Have any leaves, snow, or ice cleared regularly.  Use sand or salt on walking paths during winter.  Clean up any spills in your garage right away. This includes oil or grease spills. What can I do in the bathroom?  Use night lights.  Install grab bars by the toilet and in the tub and shower. Do not use towel bars as grab bars.  Use non-skid mats or decals  in the tub or shower.  If you need to sit down in the shower, use a plastic, non-slip stool.  Keep the floor dry. Clean up any water that spills on the floor as soon as it happens.  Remove soap buildup in the tub or shower regularly.  Attach bath mats securely with double-sided non-slip rug tape.  Do not have throw rugs and other things on the floor that can make you trip. What can I do in the bedroom?  Use night lights.  Make sure that you have a light by your bed that is easy to reach.  Do not use any sheets or blankets that are too big for your bed. They should not hang down onto the floor.  Have a firm chair that has side arms. You can use this for support while you get dressed.  Do not have throw rugs and other things on the floor that can make you trip. What can I do in the kitchen?  Clean up any spills right away.  Avoid walking on wet floors.  Keep items that you use a lot in easy-to-reach places.  If you need to reach something above you, use a strong step stool that has a grab bar.  Keep electrical cords out of  the way.  Do not use floor polish or wax that makes floors slippery. If you must use wax, use non-skid floor wax.  Do not have throw rugs and other things on the floor that can make you trip. What can I do with my stairs?  Do not leave any items on the stairs.  Make sure that there are handrails on both sides of the stairs and use them. Fix handrails that are broken or loose. Make sure that handrails are as long as the stairways.  Check any carpeting to make sure that it is firmly attached to the stairs. Fix any carpet that is loose or worn.  Avoid having throw rugs at the top or bottom of the stairs. If you do have throw rugs, attach them to the floor with carpet tape.  Make sure that you have a light switch at the top of the stairs and the bottom of the stairs. If you do not have them, ask someone to add them for you. What else can I do to help  prevent falls?  Wear shoes that:  Do not have high heels.  Have rubber bottoms.  Are comfortable and fit you well.  Are closed at the toe. Do not wear sandals.  If you use a stepladder:  Make sure that it is fully opened. Do not climb a closed stepladder.  Make sure that both sides of the stepladder are locked into place.  Ask someone to hold it for you, if possible.  Clearly mark and make sure that you can see:  Any grab bars or handrails.  First and last steps.  Where the edge of each step is.  Use tools that help you move around (mobility aids) if they are needed. These include:  Canes.  Walkers.  Scooters.  Crutches.  Turn on the lights when you go into a dark area. Replace any light bulbs as soon as they burn out.  Set up your furniture so you have a clear path. Avoid moving your furniture around.  If any of your floors are uneven, fix them.  If there are any pets around you, be aware of where they are.  Review your medicines with your doctor. Some medicines can make you feel dizzy. This can increase your chance of falling. Ask your doctor what other things that you can do to help prevent falls. This information is not intended to replace advice given to you by your health care provider. Make sure you discuss any questions you have with your health care provider. Document Released: 05/29/2009 Document Revised: 01/08/2016 Document Reviewed: 09/06/2014 Elsevier Interactive Patient Education  2017 [employer].

## 2017-11-16 ENCOUNTER — Ambulatory Visit (INDEPENDENT_AMBULATORY_CARE_PROVIDER_SITE_OTHER): Payer: Medicare Other | Admitting: Unknown Physician Specialty

## 2017-11-16 ENCOUNTER — Encounter: Payer: Self-pay | Admitting: Unknown Physician Specialty

## 2017-11-16 VITALS — BP 124/76 | HR 66 | Temp 98.2°F | Ht 66.0 in | Wt 213.0 lb

## 2017-11-16 DIAGNOSIS — E782 Mixed hyperlipidemia: Secondary | ICD-10-CM

## 2017-11-16 DIAGNOSIS — R0789 Other chest pain: Secondary | ICD-10-CM

## 2017-11-16 DIAGNOSIS — I10 Essential (primary) hypertension: Secondary | ICD-10-CM

## 2017-11-16 DIAGNOSIS — Z0000 Encounter for general adult medical examination without abnormal findings: Secondary | ICD-10-CM

## 2017-11-16 DIAGNOSIS — Z7189 Other specified counseling: Secondary | ICD-10-CM | POA: Diagnosis not present

## 2017-11-16 NOTE — Assessment & Plan Note (Addendum)
Check lipid panel.  Continue present medications

## 2017-11-16 NOTE — Patient Instructions (Signed)
Please do call to schedule your mammogram; the number to schedule one at either [facility] or [facility] is [phone]   

## 2017-11-16 NOTE — Assessment & Plan Note (Signed)
A voluntary discussion about advance care planning including the explanation and discussion of advance directives was extensively discussed  with the patient.  Explanation about the health care proxy and Living will was reviewed and packet with forms with explanation of how to fill them out was given.  During this discussion, the patient was able to identify a health care proxy she thinks is her husband but will look.   Patient was offered a separate [place] visit for further assistance with forms.  Time spent: 20 minutes       Individuals present: pt

## 2017-11-16 NOTE — Progress Notes (Signed)
BP 124/76   Pulse 66   Temp 98.2 F (36.8 C) (Oral)   Ht 5\' 6"  (1.676 m)   Wt 213 lb (96.6 kg)   LMP  (LMP Unknown)   SpO2 97%   BMI 34.38 kg/m    Subjective:    Patient ID: Denise Morris, female    DOB: 04-02-1946, 72 y.o.   MRN: [id number]  HPI: Denise Morris is a 72 y.o. female  Chief Complaint  Patient presents with  . Annual Exam    pt had wellness exam with [facility] 11/09/17   Hypertension Using medications without difficulty Average home BPs "about the same as today"   No problems or lightheadedness No chest pain with exertion or shortness of breath No Edema Has a pain left lower rib cage that comes and goes.  Taking a pill from Dr. Humphrey Rolls for indigestion. She doesn't think it is helping.  Can't ID aggravating or worsening problem.  States it is new for the last several weeks.  Ecocardiogram planned  Hyperlipidemia Using medications without problems: No Muscle aches  Diet compliance:Exercise: Diet and exercise "about the same"    Social History   Socioeconomic History  . Marital status: Married    Spouse name: Not on file  . Number of children: Not on file  . Years of education: Not on file  . Highest education level: Not on file  Occupational History  . Not on file  Social Needs  . Financial resource strain: Not hard at all  . Food insecurity:    Worry: Never true    Inability: Never true  . Transportation needs:    Medical: No    Non-medical: No  Tobacco Use  . Smoking status: Never Smoker  . Smokeless tobacco: Never Used  Substance and Sexual Activity  . Alcohol use: No  . Drug use: No  . Sexual activity: Yes  Lifestyle  . Physical activity:    Days per week: 0 days    Minutes per session: 0 min  . Stress: Not at all  Relationships  . Social connections:    Talks on phone: More than three times a week    Gets together: More than three times a week    Attends religious service: More than 4 times per year    Active member of club or  organization: Yes    Attends meetings of clubs or organizations: More than 4 times per year    Relationship status: Married  . Intimate partner violence:    Fear of current or ex partner: No    Emotionally abused: No    Physically abused: No    Forced sexual activity: No  Other Topics Concern  . Not on file  Social History Narrative   [employer]- meet monthly   [employer]- meet monthly   Family History  Problem Relation Age of Onset  . Alzheimer's disease Mother   . Hypertension Father   . Hypertension Sister   . Hyperlipidemia Sister    Past Medical History:  Diagnosis Date  . Anxiety   . Depression   . Edema   . Heart palpitations   . Hyperlipidemia   . Hypertension   . Menopausal state   . Osteopenia    Past Surgical History:  Procedure Laterality Date  . ABDOMINAL HYSTERECTOMY  1978   Partial ( Precancerous cells)    Relevant past medical, surgical, family and social history reviewed and updated as indicated. Interim medical history since our last  visit reviewed. Allergies and medications reviewed and updated.  Review of Systems  Constitutional: Negative.   HENT: Negative.   Eyes: Negative.   Respiratory: Negative.   Cardiovascular: Negative.   Endocrine: Negative.   Genitourinary: Negative.   Musculoskeletal: Negative.   Skin: Negative.   Allergic/Immunologic: Negative.   Neurological: Negative.   Hematological: Negative.   Psychiatric/Behavioral: Negative.     Per HPI unless specifically indicated above     Objective:    BP 124/76   Pulse 66   Temp 98.2 F (36.8 C) (Oral)   Ht 5\' 6"  (1.676 m)   Wt 213 lb (96.6 kg)   LMP  (LMP Unknown)   SpO2 97%   BMI 34.38 kg/m   Wt Readings from Last 3 Encounters:  11/16/17 213 lb (96.6 kg)  11/09/17 212 lb 8 oz (96.4 kg)  05/20/17 208 lb 12.8 oz (94.7 kg)    Physical Exam  Constitutional: She is oriented to person, place, and time. She appears well-developed and well-nourished. No distress.  HENT:  Head:  Normocephalic and atraumatic.  Eyes: Conjunctivae and lids are normal. Right eye exhibits no discharge. Left eye exhibits no discharge. No scleral icterus.  Neck: Normal range of motion. Neck supple. No JVD present. Carotid bruit is not present.  Cardiovascular: Normal rate, regular rhythm and normal heart sounds.  Pulmonary/Chest: Effort normal and breath sounds normal.  Abdominal: Normal appearance. There is no splenomegaly or hepatomegaly.  Musculoskeletal: Normal range of motion.  Neurological: She is alert and oriented to person, place, and time.  Skin: Skin is warm, dry and intact. No rash noted. No pallor.  Psychiatric: She has a normal mood and affect. Her behavior is normal. Judgment and thought content normal.    Results for orders placed or performed in visit on 05/20/17  Comprehensive metabolic panel  Result Value Ref Range   Glucose 106 (H) 65 - 99 mg/dL   BUN 14 8 - 27 mg/dL   Creatinine, Ser 0.73 0.57 - 1.00 mg/dL   GFR calc non Af Amer 82 >59 mL/min/1.73   GFR calc Af Amer 94 >59 mL/min/1.73   BUN/Creatinine Ratio 19 12 - 28   Sodium 142 134 - 144 mmol/L   Potassium 3.4 (L) 3.5 - 5.2 mmol/L   Chloride 105 96 - 106 mmol/L   CO2 23 20 - 29 mmol/L   Calcium 9.0 8.7 - 10.3 mg/dL   Total Protein 6.6 6.0 - 8.5 g/dL   Albumin 3.9 3.5 - 4.8 g/dL   Globulin, Total 2.7 1.5 - 4.5 g/dL   Albumin/Globulin Ratio 1.4 1.2 - 2.2   Bilirubin Total 0.6 0.0 - 1.2 mg/dL   Alkaline Phosphatase 73 39 - 117 IU/L   AST 17 0 - 40 IU/L   ALT 14 0 - 32 IU/L  Bayer DCA Hb A1c Waived  Result Value Ref Range   Bayer DCA Hb A1c Waived 6.1 <7.0 %  Lipid Panel w/o Chol/HDL Ratio  Result Value Ref Range   Cholesterol, Total 134 100 - 199 mg/dL   Triglycerides 65 0 - 149 mg/dL   HDL 46 >39 mg/dL   VLDL Cholesterol Cal 13 5 - 40 mg/dL   LDL Calculated 75 0 - 99 mg/dL      Assessment & Plan:   Problem List Items Addressed This Visit      Unprioritized   Advanced care planning/counseling  discussion    A voluntary discussion about advance care planning including the explanation and discussion of  advance directives was extensively discussed  with the patient.  Explanation about the health care proxy and Living will was reviewed and packet with forms with explanation of how to fill them out was given.  During this discussion, the patient was able to identify a health care proxy she thinks is her husband but will look.   Patient was offered a separate [facility] visit for further assistance with forms.  Time spent: 20 minutes       Individuals present: pt       Hyperlipidemia    Check lipid panel.  Continue present medications      Relevant Orders   Lipid Panel w/o Chol/HDL Ratio   Hypertension    Stable, continue present medications.        Relevant Orders   Comprehensive metabolic panel    Other Visit Diagnoses    Chest wall pain    -  Primary   also followed by Dr Humphrey Rolls.  Will get chest x-ray   Relevant Orders   DG Chest 2 View   Annual physical exam          Emphasized diet and exercise.  She will restart with warmer weather  HM: Mammogram due- number given Colonoscopy due 2021 [facility] order given  Follow up plan: Return in about 6 months (around 05/18/2018).

## 2017-11-16 NOTE — Assessment & Plan Note (Signed)
Stable, continue present medications.   

## 2017-11-17 LAB — LIPID PANEL W/O CHOL/HDL RATIO
CHOLESTEROL TOTAL: 152 mg/dL (ref 100–199)
HDL: 54 mg/dL (ref >39)
LDL CALC: 84 mg/dL (ref 0–99)
TRIGLYCERIDES: 72 mg/dL (ref 0–149)
VLDL Cholesterol Cal: 14 mg/dL (ref 5–40)

## 2017-11-17 LAB — COMPREHENSIVE METABOLIC PANEL
ALK PHOS: 81 IU/L (ref 39–117)
ALT: 13 IU/L (ref 0–32)
AST: 16 IU/L (ref 0–40)
Albumin/Globulin Ratio: 1.4 (ref 1.2–2.2)
Albumin: 3.8 g/dL (ref 3.5–4.8)
BILIRUBIN TOTAL: 0.5 mg/dL (ref 0.0–1.2)
BUN / CREAT RATIO: 14 (ref 12–28)
BUN: 13 mg/dL (ref 8–27)
CHLORIDE: 102 mmol/L (ref 96–106)
CO2: 26 mmol/L (ref 20–29)
CREATININE: 0.95 mg/dL (ref 0.57–1.00)
Calcium: 9.3 mg/dL (ref 8.7–10.3)
GFR calc Af Amer: 69 mL/min/{1.73_m2} (ref >59)
GFR calc non Af Amer: 60 mL/min/{1.73_m2} (ref >59)
GLOBULIN, TOTAL: 2.8 g/dL (ref 1.5–4.5)
GLUCOSE: 109 mg/dL — AB (ref 65–99)
Potassium: 3.8 mmol/L (ref 3.5–5.2)
SODIUM: 142 mmol/L (ref 134–144)
Total Protein: 6.6 g/dL (ref 6.0–8.5)

## 2017-11-17 NOTE — Progress Notes (Signed)
Normal labs.  Pt notified through mychart

## 2017-11-21 ENCOUNTER — Ambulatory Visit
Admission: RE | Admit: 2017-11-21 | Discharge: 2017-11-21 | Disposition: A | Discharge status: Home / Self Care | Payer: Medicare Other | Source: Ambulatory Visit | Attending: Unknown Physician Specialty | Admitting: Unknown Physician Specialty

## 2017-11-21 DIAGNOSIS — R0789 Other chest pain: Secondary | ICD-10-CM

## 2017-12-01 DIAGNOSIS — I349 Nonrheumatic mitral valve disorder, unspecified: Secondary | ICD-10-CM | POA: Diagnosis not present

## 2017-12-01 DIAGNOSIS — R002 Palpitations: Secondary | ICD-10-CM | POA: Diagnosis not present

## 2017-12-01 DIAGNOSIS — I10 Essential (primary) hypertension: Secondary | ICD-10-CM | POA: Diagnosis not present

## 2017-12-01 DIAGNOSIS — E785 Hyperlipidemia, unspecified: Secondary | ICD-10-CM | POA: Diagnosis not present

## 2018-01-17 ENCOUNTER — Ambulatory Visit
Admission: RE | Admit: 2018-01-17 | Discharge: 2018-01-17 | Disposition: A | Discharge status: Home / Self Care | Payer: Medicare Other | Source: Ambulatory Visit | Attending: Family Medicine | Admitting: Family Medicine

## 2018-01-17 DIAGNOSIS — Z1239 Encounter for other screening for malignant neoplasm of breast: Secondary | ICD-10-CM

## 2018-01-17 DIAGNOSIS — Z1231 Encounter for screening mammogram for malignant neoplasm of breast: Secondary | ICD-10-CM | POA: Diagnosis not present

## 2018-01-19 ENCOUNTER — Encounter: Payer: Self-pay | Admitting: Family Medicine

## 2018-02-28 ENCOUNTER — Other Ambulatory Visit: Payer: Self-pay | Admitting: Unknown Physician Specialty

## 2018-03-15 ENCOUNTER — Other Ambulatory Visit: Payer: Self-pay | Admitting: Unknown Physician Specialty

## 2018-03-16 NOTE — Telephone Encounter (Signed)
Req. Refill on Metoprolol; LRF 05/20/17; #90; RF x 1  Last OV; 11/16/17  PCP: [facility]: [facility] [place], [place]  Refilled per protocol

## 2018-04-03 DIAGNOSIS — R002 Palpitations: Secondary | ICD-10-CM | POA: Diagnosis not present

## 2018-04-03 DIAGNOSIS — I10 Essential (primary) hypertension: Secondary | ICD-10-CM | POA: Diagnosis not present

## 2018-04-03 DIAGNOSIS — E785 Hyperlipidemia, unspecified: Secondary | ICD-10-CM | POA: Diagnosis not present

## 2018-04-03 DIAGNOSIS — I349 Nonrheumatic mitral valve disorder, unspecified: Secondary | ICD-10-CM | POA: Diagnosis not present

## 2018-04-11 DIAGNOSIS — H5203 Hypermetropia, bilateral: Secondary | ICD-10-CM | POA: Diagnosis not present

## 2018-04-11 DIAGNOSIS — H43813 Vitreous degeneration, bilateral: Secondary | ICD-10-CM | POA: Diagnosis not present

## 2018-04-11 DIAGNOSIS — H524 Presbyopia: Secondary | ICD-10-CM | POA: Diagnosis not present

## 2018-04-11 DIAGNOSIS — H52223 Regular astigmatism, bilateral: Secondary | ICD-10-CM | POA: Diagnosis not present

## 2018-04-11 DIAGNOSIS — H2513 Age-related nuclear cataract, bilateral: Secondary | ICD-10-CM | POA: Diagnosis not present

## 2018-05-31 ENCOUNTER — Other Ambulatory Visit: Payer: Self-pay

## 2018-05-31 ENCOUNTER — Encounter: Payer: Self-pay | Admitting: Nurse Practitioner

## 2018-05-31 ENCOUNTER — Ambulatory Visit: Payer: Medicare Other | Admitting: Unknown Physician Specialty

## 2018-05-31 ENCOUNTER — Ambulatory Visit (INDEPENDENT_AMBULATORY_CARE_PROVIDER_SITE_OTHER): Payer: Medicare Other | Admitting: Nurse Practitioner

## 2018-05-31 VITALS — BP 124/75 | HR 66 | Temp 98.5°F | Ht 66.0 in | Wt 209.0 lb

## 2018-05-31 DIAGNOSIS — I10 Essential (primary) hypertension: Secondary | ICD-10-CM

## 2018-05-31 DIAGNOSIS — R7301 Impaired fasting glucose: Secondary | ICD-10-CM | POA: Diagnosis not present

## 2018-05-31 DIAGNOSIS — Z23 Encounter for immunization: Secondary | ICD-10-CM

## 2018-05-31 DIAGNOSIS — R7303 Prediabetes: Secondary | ICD-10-CM | POA: Diagnosis not present

## 2018-05-31 DIAGNOSIS — E782 Mixed hyperlipidemia: Secondary | ICD-10-CM | POA: Diagnosis not present

## 2018-05-31 LAB — BAYER DCA HB A1C WAIVED: HB A1C: 6.1 % (ref <7.0)

## 2018-05-31 MED ORDER — HYDROCORTISONE 2.5 % EX CREA: TOPICAL_CREAM | CUTANEOUS | 2 refills | Status: DC | PRN

## 2018-05-31 NOTE — Assessment & Plan Note (Addendum)
Chronic, stable.  Continue current medication regimen.  Will obtain labs today lipid panel, A1C, CMP, and CBC.

## 2018-05-31 NOTE — Progress Notes (Signed)
BP 124/75   Pulse 66   Temp 98.5 F (36.9 C) (Oral)   Ht 5\' 6"  (1.676 m)   Wt 209 lb (94.8 kg)   LMP  (LMP Unknown)   SpO2 96%   BMI 33.73 kg/m    Subjective:    Patient ID: Denise Morris, female    DOB: 1946-01-23, 72 y.o.   MRN: [id number]  HPI: Denise Morris is a 72 y.o. female  Chief Complaint  Patient presents with  . Hypertension    6 m f/u  . Hyperlipidemia  . Medication Refill    hydrocortisone   HYPERTENSION / HYPERLIPIDEMIA Satisfied with current treatment? yes Duration of hypertension: chronic BP monitoring frequency: daily BP range:  At home 120's over 80's at home BP medication side effects: no Past BP meds:  Does not recall Duration of hyperlipidemia: chronic Cholesterol medication side effects: no Cholesterol supplements: none Past cholesterol medications: does not recall  Medication compliance: excellent compliance Aspirin: yes Recent stressors: no Recurrent headaches: no Visual changes: no Palpitations: no Dyspnea: no Chest pain: no Lower extremity edema: no Dizzy/lightheaded: no  Sees Dr. Humphrey Rolls and last saw six months ago for heart care.  She visits with him twice a year.  Reports she has been focusing on weight loss, with recent loss present.  Praised her and encouraged continued focus on diet and weight loss.   Relevant past medical, surgical, family and social history reviewed and updated as indicated. Interim medical history since our last visit reviewed. Allergies and medications reviewed and updated.  Review of Systems  Constitutional: Negative for activity change, appetite change and fatigue.  Respiratory: Negative for cough, chest tightness and shortness of breath.   Cardiovascular: Positive for leg swelling. Negative for chest pain and palpitations.       Reports leg edema at baseline with no increase.   Wears support hose.  Gastrointestinal: Negative for abdominal distention, abdominal pain, constipation, diarrhea, nausea and  vomiting.  Neurological: Negative for dizziness, numbness and headaches.     Per HPI unless specifically indicated above     Objective:    BP 124/75   Pulse 66   Temp 98.5 F (36.9 C) (Oral)   Ht 5\' 6"  (1.676 m)   Wt 209 lb (94.8 kg)   LMP  (LMP Unknown)   SpO2 96%   BMI 33.73 kg/m   Wt Readings from Last 3 Encounters:  05/31/18 209 lb (94.8 kg)  11/16/17 213 lb (96.6 kg)  11/09/17 212 lb 8 oz (96.4 kg)    Physical Exam  Constitutional: She is oriented to person, place, and time. She appears well-developed and well-nourished.  HENT:  Head: Normocephalic and atraumatic.  Eyes: Pupils are equal, round, and reactive to light.  Neck: Normal range of motion. Neck supple.  Cardiovascular: Normal rate, regular rhythm and normal heart sounds.  Pulmonary/Chest: Effort normal and breath sounds normal.  Abdominal: Soft. Bowel sounds are normal.  Neurological: She is alert and oriented to person, place, and time.  Skin: Skin is warm and dry.  Psychiatric: She has a normal mood and affect. Her behavior is normal. Judgment and thought content normal.     Results for orders placed or performed in visit on 11/16/17  Comprehensive metabolic panel  Result Value Ref Range   Glucose 109 (H) 65 - 99 mg/dL   BUN 13 8 - 27 mg/dL   Creatinine, Ser 0.95 0.57 - 1.00 mg/dL   GFR calc non Af Amer 60 >59  mL/min/1.73   GFR calc Af Amer 69 >59 mL/min/1.73   BUN/Creatinine Ratio 14 12 - 28   Sodium 142 134 - 144 mmol/L   Potassium 3.8 3.5 - 5.2 mmol/L   Chloride 102 96 - 106 mmol/L   CO2 26 20 - 29 mmol/L   Calcium 9.3 8.7 - 10.3 mg/dL   Total Protein 6.6 6.0 - 8.5 g/dL   Albumin 3.8 3.5 - 4.8 g/dL   Globulin, Total 2.8 1.5 - 4.5 g/dL   Albumin/Globulin Ratio 1.4 1.2 - 2.2   Bilirubin Total 0.5 0.0 - 1.2 mg/dL   Alkaline Phosphatase 81 39 - 117 IU/L   AST 16 0 - 40 IU/L   ALT 13 0 - 32 IU/L  Lipid Panel w/o Chol/HDL Ratio  Result Value Ref Range   Cholesterol, Total 152 100 - 199 mg/dL     Triglycerides 72 0 - 149 mg/dL   HDL 54 >39 mg/dL   VLDL Cholesterol Cal 14 5 - 40 mg/dL   LDL Calculated 84 0 - 99 mg/dL      Assessment & Plan:   Problem List Items Addressed This Visit      Cardiovascular and Mediastinum   Hypertension    Chronic, stable.  Continue current medication regimen.  Will obtain labs today lipid panel, A1C, CMP, and CBC.      Relevant Orders   Bayer DCA Hb A1c Waived (STAT)   Lipid Profile   CBC   COMPLETE METABOLIC PANEL WITH GFR     Other   Hyperlipidemia    Chronic, ongoing. Lipid panel today.  Continue Pravastatin.       Other Visit Diagnoses    Flu vaccine need    -  Primary   Relevant Orders   Flu vaccine HIGH DOSE PF (Completed)       Follow up plan: Return in about 6 months (around 11/30/2018), or if symptoms worsen or fail to improve, for HTN and HLD/annual exam.

## 2018-05-31 NOTE — Patient Instructions (Signed)
Diabetes Mellitus and Nutrition When you have diabetes (diabetes mellitus), it is very important to have healthy eating habits because your blood sugar (glucose) levels are greatly affected by what you eat and drink. Eating healthy foods in the appropriate amounts, at about the same times every day, can help you:  Control your blood glucose.  Lower your risk of heart disease.  Improve your blood pressure.  Reach or maintain a healthy weight.  Every person with diabetes is different, and each person has different needs for a meal plan. Your health care provider may recommend that you work with a diet and nutrition specialist (dietitian) to make a meal plan that is best for you. Your meal plan may vary depending on factors such as:  The calories you need.  The medicines you take.  Your weight.  Your blood glucose, blood pressure, and cholesterol levels.  Your activity level.  Other health conditions you have, such as heart or kidney disease.  How do carbohydrates affect me? Carbohydrates affect your blood glucose level more than any other type of food. Eating carbohydrates naturally increases the amount of glucose in your blood. Carbohydrate counting is a method for keeping track of how many carbohydrates you eat. Counting carbohydrates is important to keep your blood glucose at a healthy level, especially if you use insulin or take certain oral diabetes medicines. It is important to know how many carbohydrates you can safely have in each meal. This is different for every person. Your dietitian can help you calculate how many carbohydrates you should have at each meal and for snack. Foods that contain carbohydrates include:  Bread, cereal, rice, pasta, and crackers.  Potatoes and corn.  Peas, beans, and lentils.  Milk and yogurt.  Fruit and juice.  Desserts, such as cakes, cookies, ice cream, and candy.  How does alcohol affect me? Alcohol can cause a sudden decrease in blood  glucose (hypoglycemia), especially if you use insulin or take certain oral diabetes medicines. Hypoglycemia can be a life-threatening condition. Symptoms of hypoglycemia (sleepiness, dizziness, and confusion) are similar to symptoms of having too much alcohol. If your health care provider says that alcohol is safe for you, follow these guidelines:  Limit alcohol intake to no more than 1 drink per day for nonpregnant women and 2 drinks per day for men. One drink equals 12 oz of beer, 5 oz of wine, or 1 oz of hard liquor.  Do not drink on an empty stomach.  Keep yourself hydrated with water, diet soda, or unsweetened iced tea.  Keep in mind that regular soda, juice, and other mixers may contain a lot of sugar and must be counted as carbohydrates.  What are tips for following this plan? Reading food labels  Start by checking the serving size on the label. The amount of calories, carbohydrates, fats, and other nutrients listed on the label are based on one serving of the food. Many foods contain more than one serving per package.  Check the total grams (g) of carbohydrates in one serving. You can calculate the number of servings of carbohydrates in one serving by dividing the total carbohydrates by 15. For example, if a food has 30 g of total carbohydrates, it would be equal to 2 servings of carbohydrates.  Check the number of grams (g) of saturated and trans fats in one serving. Choose foods that have low or no amount of these fats.  Check the number of milligrams (mg) of sodium in one serving. Most people   should limit total sodium intake to less than 2,300 mg per day.  Always check the nutrition information of foods labeled as "low-fat" or "nonfat". These foods may be higher in added sugar or refined carbohydrates and should be avoided.  Talk to your dietitian to identify your daily goals for nutrients listed on the label. Shopping  Avoid buying canned, premade, or processed foods. These  foods tend to be high in fat, sodium, and added sugar.  Shop around the outside edge of the grocery store. This includes fresh fruits and vegetables, bulk grains, fresh meats, and fresh dairy. Cooking  Use low-heat cooking methods, such as baking, instead of high-heat cooking methods like deep frying.  Cook using healthy oils, such as olive, canola, or sunflower oil.  Avoid cooking with butter, cream, or high-fat meats. Meal planning  Eat meals and snacks regularly, preferably at the same times every day. Avoid going long periods of time without eating.  Eat foods high in fiber, such as fresh fruits, vegetables, beans, and whole grains. Talk to your dietitian about how many servings of carbohydrates you can eat at each meal.  Eat 4-6 ounces of lean protein each day, such as lean meat, chicken, fish, eggs, or tofu. 1 ounce is equal to 1 ounce of meat, chicken, or fish, 1 egg, or 1/4 cup of tofu.  Eat some foods each day that contain healthy fats, such as avocado, nuts, seeds, and fish. Lifestyle   Check your blood glucose regularly.  Exercise at least 30 minutes 5 or more days each week, or as told by your health care provider.  Take medicines as told by your health care provider.  Do not use any products that contain nicotine or tobacco, such as cigarettes and e-cigarettes. If you need help quitting, ask your health care provider.  Work with a counselor or diabetes educator to identify strategies to manage stress and any emotional and social challenges. What are some questions to ask my health care provider?  Do I need to meet with a diabetes educator?  Do I need to meet with a dietitian?  What number can I call if I have questions?  When are the best times to check my blood glucose? Where to find more information:  American Diabetes Association: diabetes.org/food-and-fitness/food  Academy of Nutrition and Dietetics:  www.eatright.org/resources/health/diseases-and-conditions/diabetes  National Institute of Diabetes and Digestive and Kidney Diseases (NIH): www.niddk.nih.gov/health-information/diabetes/overview/diet-eating-physical-activity Summary  A healthy meal plan will help you control your blood glucose and maintain a healthy lifestyle.  Working with a diet and nutrition specialist (dietitian) can help you make a meal plan that is best for you.  Keep in mind that carbohydrates and alcohol have immediate effects on your blood glucose levels. It is important to count carbohydrates and to use alcohol carefully. This information is not intended to replace advice given to you by your health care provider. Make sure you discuss any questions you have with your health care provider. Document Released: 04/29/2005 Document Revised: 09/06/2016 Document Reviewed: 09/06/2016 Elsevier Interactive Patient Education  2018 Elsevier Inc.  

## 2018-05-31 NOTE — Assessment & Plan Note (Signed)
Chronic, ongoing. Lipid panel today.  Continue Pravastatin.

## 2018-06-01 LAB — CBC
Hematocrit: 44 % (ref 34.0–46.6)
Hemoglobin: 14.3 g/dL (ref 11.1–15.9)
MCH: 28.7 pg (ref 26.6–33.0)
MCHC: 32.5 g/dL (ref 31.5–35.7)
MCV: 88 fL (ref 79–97)
PLATELETS: 186 10*3/uL (ref 150–450)
RBC: 4.98 x10E6/uL (ref 3.77–5.28)
RDW: 13.1 % (ref 12.3–15.4)
WBC: 5.5 10*3/uL (ref 3.4–10.8)

## 2018-06-01 LAB — LIPID PANEL
CHOLESTEROL TOTAL: 167 mg/dL (ref 100–199)
Chol/HDL Ratio: 3.7 ratio (ref 0.0–4.4)
HDL: 45 mg/dL (ref >39)
LDL CALC: 104 mg/dL — AB (ref 0–99)
Triglycerides: 90 mg/dL (ref 0–149)
VLDL CHOLESTEROL CAL: 18 mg/dL (ref 5–40)

## 2018-07-19 ENCOUNTER — Encounter: Payer: Self-pay | Admitting: Nurse Practitioner

## 2018-07-19 DIAGNOSIS — E1169 Type 2 diabetes mellitus with other specified complication: Secondary | ICD-10-CM | POA: Insufficient documentation

## 2018-07-19 DIAGNOSIS — R7303 Prediabetes: Secondary | ICD-10-CM | POA: Insufficient documentation

## 2018-07-19 DIAGNOSIS — R7309 Other abnormal glucose: Secondary | ICD-10-CM | POA: Insufficient documentation

## 2018-07-19 DIAGNOSIS — R7301 Impaired fasting glucose: Secondary | ICD-10-CM | POA: Insufficient documentation

## 2018-07-19 DIAGNOSIS — E669 Obesity, unspecified: Secondary | ICD-10-CM | POA: Insufficient documentation

## 2018-08-25 ENCOUNTER — Other Ambulatory Visit: Payer: Self-pay | Admitting: Unknown Physician Specialty

## 2018-08-25 ENCOUNTER — Other Ambulatory Visit: Payer: Self-pay | Admitting: *Deleted

## 2018-08-25 MED ORDER — PRAVASTATIN SODIUM 40 MG PO TABS: 40.0000 mg | ORAL_TABLET | Freq: Every day | ORAL | 0 refills | Status: DC

## 2018-08-25 NOTE — Progress Notes (Signed)
Requested Prescriptions  Pending Prescriptions Disp Refills  . pravastatin (PRAVACHOL) 40 MG tablet 90 tablet 1    Sig: Take 1 tablet (40 mg total) by mouth daily.     There is no refill protocol information for this order

## 2018-09-09 ENCOUNTER — Other Ambulatory Visit: Payer: Self-pay | Admitting: Unknown Physician Specialty

## 2018-09-11 NOTE — Telephone Encounter (Signed)
Requested Prescriptions  Pending Prescriptions Disp Refills  . metoprolol succinate (TOPROL-XL) 50 MG 24 hr tablet [Pharmacy Med Name: METOPROLOL SUCC ER 50 MG TAB] 90 tablet 1    Sig: TAKE 1 TABLET (50 MG TOTAL) BY MOUTH DAILY. TAKE WITH OR IMMEDIATELY FOLLOWING A MEAL.     Cardiovascular:  Beta Blockers Passed - 09/09/2018  9:40 AM      Passed - Last BP in normal range    BP Readings from Last 1 Encounters:  05/31/18 124/75         Passed - Last Heart Rate in normal range    Pulse Readings from Last 1 Encounters:  05/31/18 66         Passed - Valid encounter within last 6 months    Recent Outpatient Visits          3 months ago Flu vaccine need   [facility] Hector, [place] T, NP   9 months ago Chest wall pain   [facility] Kathrine Haddock, NP   1 year ago Essential hypertension   [place] Kathrine Haddock, NP   1 year ago Annual physical exam   [facility] Kathrine Haddock, NP   2 years ago Hyperlipidemia   [facility] Kathrine Haddock, NP      Future Appointments            In 2 months  [place], [place]   In 2 months Cannady, Barbaraann Faster, NP [employer], [place]

## 2018-10-22 ENCOUNTER — Emergency Department
Admission: EM | Admit: 2018-10-22 | Discharge: 2018-10-22 | Disposition: A | Discharge status: Home / Self Care | Payer: Medicare Other | Attending: Emergency Medicine | Admitting: Emergency Medicine

## 2018-10-22 ENCOUNTER — Other Ambulatory Visit: Payer: Self-pay

## 2018-10-22 ENCOUNTER — Emergency Department: Payer: Medicare Other

## 2018-10-22 DIAGNOSIS — Z7982 Long term (current) use of aspirin: Secondary | ICD-10-CM | POA: Insufficient documentation

## 2018-10-22 DIAGNOSIS — M545 Low back pain, unspecified: Secondary | ICD-10-CM

## 2018-10-22 DIAGNOSIS — Z79899 Other long term (current) drug therapy: Secondary | ICD-10-CM | POA: Insufficient documentation

## 2018-10-22 DIAGNOSIS — M5136 Other intervertebral disc degeneration, lumbar region: Secondary | ICD-10-CM | POA: Insufficient documentation

## 2018-10-22 DIAGNOSIS — I10 Essential (primary) hypertension: Secondary | ICD-10-CM | POA: Insufficient documentation

## 2018-10-22 DIAGNOSIS — S3992XA Unspecified injury of lower back, initial encounter: Secondary | ICD-10-CM | POA: Diagnosis not present

## 2018-10-22 MED ORDER — CYCLOBENZAPRINE HCL 5 MG PO TABS: 5.0000 mg | ORAL_TABLET | Freq: Three times a day (TID) | ORAL | 0 refills | Status: DC | PRN

## 2018-10-22 NOTE — ED Triage Notes (Signed)
Pt hurt her lower back last week after bending down to pick something up. Pt reports that it hasn't gotten any better until today, and is now starting to feel a little better. Pt rates pain as a 0 when being still but a 5 when pt is moving.

## 2018-10-22 NOTE — ED Provider Notes (Signed)
[facility] EMERGENCY DEPARTMENT Provider Note   CSN: [id number] Arrival date & time: 10/22/18  1059    History   Chief Complaint Chief Complaint  Patient presents with  . Back Pain    HPI Denise Morris is a 73 y.o. female.  Presents to the emergency department for evaluation of acute low back pain.  Patient states 8 days ago she was lifting bags of dirt and working in her flower garden.  She states 2 days later she developed low back pain that she describes as aching increased with standing from a seated position.  6 days ago patient's pain was moderate to severe and she had a hard time walking.  Today pain is much improved she is able to ambulate but still has pain with standing from a seated position along the midline of the lumbar spine with some mild radiation to the left and right lumbosacral junction with no radicular symptoms down the lower extremities.  She denies any abdominal pain.  No ripping or tearing pains.  She does have some mild muscle tightness that is increased after standing for long periods of time.  She is been taken ibuprofen with some relief.  No history of compression fractures, osteoporosis.  She does not smoke.  No groin pain, trauma or injury.     HPI  Past Medical History:  Diagnosis Date  . Anxiety   . Depression   . Edema   . Heart palpitations   . Hyperlipidemia   . Hypertension   . Menopausal state   . Osteopenia     Patient Active Problem List   Diagnosis Date Noted  . Prediabetes 07/19/2018  . Advanced care planning/counseling discussion 10/19/2016  . Osteopenia 02/12/2015  . Depression 02/12/2015  . Hyperlipidemia 02/12/2015  . Hypertension 02/12/2015  . Impaired fasting glucose 02/12/2015  . 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 ABDOMINAL HYSTERECTOMY  1978   Partial ( Precancerous cells)     OB History   No obstetric history on file.       Home Medications    Prior to Admission medications   Medication Sig Start Date End Date Taking? Authorizing Provider  aspirin 81 MG tablet Take 81 mg by mouth daily.    [provider]  Calcium Carbonate-Vitamin D (CALCIUM HIGH POTENCY/VITAMIN D) 600-200 MG-UNIT TABS Take by mouth. 07/14/11   [provider]  cyclobenzaprine (FLEXERIL) 5 MG tablet Take 1-2 tablets (5-10 mg total) by mouth 3 (three) times daily as needed for muscle spasms. 10/22/18   Duanne Guess, PA-C  hydrocortisone 2.5 % cream Apply topically as needed. 05/31/18   Cannady, Henrine Screws T, NP  losartan-hydrochlorothiazide (HYZAAR) 100-25 MG tablet Take 1 tablet by mouth daily. 05/20/17   Kathrine Haddock, NP  metoprolol succinate (TOPROL-XL) 50 MG 24 hr tablet TAKE 1 TABLET (50 MG TOTAL) BY MOUTH DAILY. TAKE WITH OR IMMEDIATELY FOLLOWING A MEAL. 09/11/18   Cannady, Henrine Screws T, NP  pravastatin (PRAVACHOL) 40 MG tablet Take 1 tablet (40 mg total) by mouth daily. 08/25/18   Venita Lick, NP    Family History Family History  Problem Relation Age of Onset  . Alzheimer's disease Mother   . Hypertension Father   . Hypertension Sister   . Hyperlipidemia Sister   . Breast cancer Neg Hx     Social History Social History   Tobacco Use  . Smoking status: Never Smoker  . Smokeless  tobacco: Never Used  Substance Use Topics  . Alcohol use: No  . Drug use: No     Allergies   Citalopram hydrobromide; Penicillins; and Zestril [lisinopril]   Review of Systems Review of Systems  Constitutional: Negative for activity change.  Eyes: Negative for pain and visual disturbance.  Respiratory: Negative for shortness of breath.   Cardiovascular: Negative for chest pain and leg swelling.  Gastrointestinal: Negative for abdominal pain.  Genitourinary: Negative for flank pain and pelvic pain.  Musculoskeletal: Positive for back pain. Negative for arthralgias, gait problem, joint swelling, myalgias, neck pain and  neck stiffness.  Skin: Negative for wound.  Neurological: Negative for dizziness, syncope, weakness, light-headedness, numbness and headaches.  Psychiatric/Behavioral: Negative for confusion and decreased concentration.     Physical Exam Updated Vital Signs BP (!) 116/59 (BP Location: Left Arm)   Pulse 70   Temp 98.4 F (36.9 C) (Oral)   Resp 18   Ht 5\' 6"  (1.676 m)   Wt 84.4 kg   LMP  (LMP Unknown)   SpO2 100%   BMI 30.02 kg/m   Physical Exam Constitutional:      Appearance: She is well-developed.  HENT:     Head: Normocephalic and atraumatic.  Eyes:     Conjunctiva/sclera: Conjunctivae normal.  Neck:     Musculoskeletal: Normal range of motion.  Cardiovascular:     Rate and Rhythm: Normal rate.  Pulmonary:     Effort: Pulmonary effort is normal. No respiratory distress.  Musculoskeletal: Normal range of motion.     Comments: Lumbar Spine: Examination of the lumbar spine reveals no bony abnormality, no edema, and no ecchymosis.  There is no step off.  The patient has full range of motion of the lumbar spine with flexion and extension.  The patient has normal lateral bend and rotation.  Patient has pain with lumbar flexion and extension.  The patient is tender along the lower lumbar spinous process in the left and right paravertebral muscles of the lumbar spine just above the left and right SI joints.  The patient is non tender along the iliac crest.  The patient is non tender in the sciatic notch.  The patient is non tender along the Sacroiliac joint.  There is no Coccyx joint tenderness.    Bilateral Lower Extremities: Examination of the lower extremities reveals no bony abnormality, no edema, and no ecchymosis.  The patient has full active and passive range of motion of the hips, knees, and ankles.  There is no discomfort with range of motion exercises.  The patient is non tender along the greater trochanter region.  The patient has a negative Bevelyn Buckles' test bilaterally.   There is normal skin warmth.  There is normal capillary refill bilaterally.    Neurologic: The patient has a negative straight leg raise.  The patient has normal muscle strength testing for the quadriceps, calves, ankle dorsiflexion, ankle plantarflexion, and extensor hallicus longus.  The patient has sensation that is intact to light touch.    Skin:    General: Skin is warm.     Findings: No rash.  Neurological:     Mental Status: She is alert and oriented to person, place, and time.  Psychiatric:        Behavior: Behavior normal.        Thought Content: Thought content normal.        Judgment: Judgment normal.      ED Treatments / Results  Labs (all labs ordered are listed,  but only abnormal results are displayed) Labs Reviewed - No data to display  EKG None  Radiology Dg Lumbar Spine 2-3 Views  Result Date: 10/22/2018 CLINICAL DATA:  Back injury 1 week ago EXAM: LUMBAR SPINE - 2-3 VIEW COMPARISON:  None. FINDINGS: L3-4 and even greater/advanced L5-S1 degenerative disc narrowing with spurring. Mild L4-5 disc narrowing. No evidence of fracture or bone lesion. Lower lumbar facet spurring. Osteopenic appearance without evident fracture. No acute soft tissue finding. Lateral views limited by rotation. IMPRESSION: 1. No acute finding. 2. L3-4 and L5-S1 disc degeneration. Electronically Signed   By: Monte Fantasia M.D.   On: 10/22/2018 12:28    Procedures Procedures (including critical care time)  Medications Ordered in ED Medications - No data to display   Initial Impression / Assessment and Plan / ED Course  I have reviewed the triage vital signs and the nursing notes.  Pertinent labs & imaging results that were available during my care of the patient were reviewed by me and considered in my medical decision making (see chart for details).        73 year old female with acute low back pain.  No evidence of compression deformity.  No radiculopathy.  No neurological  deficits.  No abdominal pain.  Vital signs are stable.  She is advised to continue Tylenol and ibuprofen.  She is given Flexeril, low-dose.  She is also advised to use topical lidocaine patches.  She understands signs symptoms return to ED for.  Final Clinical Impressions(s) / ED Diagnoses   Final diagnoses:  Acute bilateral low back pain without sciatica  Lumbar degenerative disc disease    ED Discharge Orders         Ordered    cyclobenzaprine (FLEXERIL) 5 MG tablet  3 times daily PRN     10/22/18 1246           Renata Caprice 10/22/18 1247    Arta Silence, MD 10/22/18 1601

## 2018-10-22 NOTE — Discharge Instructions (Addendum)
Please apply heating pad to lower back.  Use Tylenol and ibuprofen for mild to moderate pain.  Use Flexeril as needed for muscle tightness and moderate back pain.  Use salon pas can be purchased over-the-counter, apply daily to the lower back.  Return to the ER for any increasing pain worsening symptoms or urgent changes in health.

## 2018-11-14 ENCOUNTER — Telehealth: Payer: Self-pay

## 2018-11-14 NOTE — Telephone Encounter (Signed)
Patient scheduled for an AWV on 11/15/2018 with [facility], Due to Covid-19 pandemic this is unable to be done in office, called patient to see if she was able to do this virtually. Patient unable to access any video call applications. She prefers to reschedule for an in office visit. Rescheduled awv and cpe, patient verified change and will also receive on my chart.

## 2018-11-15 ENCOUNTER — Ambulatory Visit: Payer: Medicare Other

## 2018-11-19 ENCOUNTER — Other Ambulatory Visit: Payer: Self-pay | Admitting: Nurse Practitioner

## 2018-11-30 ENCOUNTER — Encounter: Payer: Federal, State, Local not specified - PPO | Admitting: Nurse Practitioner

## 2018-12-04 DIAGNOSIS — R0602 Shortness of breath: Secondary | ICD-10-CM | POA: Diagnosis not present

## 2018-12-04 DIAGNOSIS — I340 Nonrheumatic mitral (valve) insufficiency: Secondary | ICD-10-CM | POA: Diagnosis not present

## 2018-12-04 DIAGNOSIS — I351 Nonrheumatic aortic (valve) insufficiency: Secondary | ICD-10-CM | POA: Diagnosis not present

## 2018-12-04 DIAGNOSIS — I10 Essential (primary) hypertension: Secondary | ICD-10-CM | POA: Diagnosis not present

## 2018-12-04 DIAGNOSIS — R002 Palpitations: Secondary | ICD-10-CM | POA: Diagnosis not present

## 2018-12-04 DIAGNOSIS — E785 Hyperlipidemia, unspecified: Secondary | ICD-10-CM | POA: Diagnosis not present

## 2018-12-19 DIAGNOSIS — I349 Nonrheumatic mitral valve disorder, unspecified: Secondary | ICD-10-CM | POA: Diagnosis not present

## 2018-12-21 DIAGNOSIS — E785 Hyperlipidemia, unspecified: Secondary | ICD-10-CM | POA: Diagnosis not present

## 2018-12-21 DIAGNOSIS — I10 Essential (primary) hypertension: Secondary | ICD-10-CM | POA: Diagnosis not present

## 2018-12-21 DIAGNOSIS — R002 Palpitations: Secondary | ICD-10-CM | POA: Diagnosis not present

## 2018-12-21 DIAGNOSIS — R0602 Shortness of breath: Secondary | ICD-10-CM | POA: Diagnosis not present

## 2019-01-01 ENCOUNTER — Encounter: Payer: Medicare Other | Admitting: Nurse Practitioner

## 2019-01-01 ENCOUNTER — Ambulatory Visit (INDEPENDENT_AMBULATORY_CARE_PROVIDER_SITE_OTHER): Payer: Medicare Other

## 2019-01-01 VITALS — BP 127/75 | HR 64 | Temp 97.6°F | Ht 66.0 in | Wt 195.0 lb

## 2019-01-01 DIAGNOSIS — Z1239 Encounter for other screening for malignant neoplasm of breast: Secondary | ICD-10-CM

## 2019-01-01 DIAGNOSIS — Z0000 Encounter for general adult medical examination without abnormal findings: Secondary | ICD-10-CM | POA: Diagnosis not present

## 2019-01-01 NOTE — Progress Notes (Signed)
Subjective:   Denise Morris is a 73 y.o. female who presents for Medicare Annual (Subsequent) preventive examination.  This visit is being conducted via phone call  - after an attmept to do on video chat - due to the COVID-19 pandemic. This patient has given me verbal consent via phone to conduct this visit, patient states they are participating from their home address. Some vital signs may be absent or patient reported.   Patient identification: identified by name, DOB, and current address.    Review of Systems:   Cardiac Risk Factors include: hypertension;dyslipidemia;advanced age (>8men, >30 women)     Objective:     Vitals: BP 127/75 Comment: patient reported  Pulse 64 Comment: patient reported  Temp 97.6 F (36.4 C) Comment: patient reported  Ht 5\' 6"  (1.676 m) Comment: patient reported  Wt 195 lb (88.5 kg) Comment: patient reported  LMP  (LMP Unknown)   BMI 31.47 kg/m   Body mass index is 31.47 kg/m.  Advanced Directives 01/01/2019 10/22/2018 11/09/2017 10/19/2016 10/19/2016 09/16/2015 09/16/2015  Does Patient Have a Medical Advance Directive? Yes Yes Yes - Yes Yes Yes  Type of Advance Directive Living will;Healthcare Power of [place];Living will [place];Living will - Living will;Healthcare Power of [place];Living will [place];Living will  Does patient want to make changes to medical advance directive? - - - - - No - Patient declined -  Copy of [place] in Chart? No - copy requested - No - copy requested No - copy requested Yes No - copy requested -    Tobacco Social History   Tobacco Use  Smoking Status Never Smoker  Smokeless Tobacco Never Used     Counseling given: Not Answered   Clinical Intake:  Pre-visit preparation completed: Yes  Pain : No/denies pain     Nutritional Status: BMI > 30  Obese Nutritional Risks: None Diabetes: No  How  often do you need to have someone help you when you read instructions, pamphlets, or other written materials from your doctor or pharmacy?: 1 - Never What is the last grade level you completed in school?: masters   Interpreter Needed?: No  Information entered by :: Tiffany Hill,LPN  Past Medical History:  Diagnosis Date  . Anxiety   . Depression   . Edema   . Heart palpitations   . Hyperlipidemia   . Hypertension   . Menopausal state   . Osteopenia    Past Surgical History:  Procedure Laterality Date  . ABDOMINAL HYSTERECTOMY  1978   Partial ( Precancerous cells)   Family History  Problem Relation Age of Onset  . Alzheimer's disease Mother   . Hypertension Father   . Hypertension Sister   . Hyperlipidemia Sister   . Breast cancer Neg Hx    Social History   Socioeconomic History  . Marital status: Widowed    Spouse name: Not on file  . Number of children: Not on file  . Years of education: Not on file  . Highest education level: Master's degree (e.g., MA, MS, MEng, MEd, MSW, MBA)  Occupational History  . Occupation: retired  Scientific laboratory technician  . Financial resource strain: Not hard at all  . Food insecurity:    Worry: Never true    Inability: Never true  . Transportation needs:    Medical: No    Non-medical: No  Tobacco Use  . Smoking status: Never Smoker  .  Smokeless tobacco: Never Used  Substance and Sexual Activity  . Alcohol use: No  . Drug use: No  . Sexual activity: Yes  Lifestyle  . Physical activity:    Days per week: 4 days    Minutes per session: 30 min  . Stress: Not at all  Relationships  . Social connections:    Talks on phone: More than three times a week    Gets together: More than three times a week    Attends religious service: More than 4 times per year    Active member of club or organization: Yes    Attends meetings of clubs or organizations: More than 4 times per year    Relationship status: Married  Other Topics Concern  . Not on  file  Social History Narrative   [employer]- meet monthly   [employer]- meet monthly    Outpatient Encounter Medications as of 01/01/2019  Medication Sig  . aspirin 81 MG tablet Take 81 mg by mouth daily.  . Calcium Carbonate-Vitamin D (CALCIUM HIGH POTENCY/VITAMIN D) 600-200 MG-UNIT TABS Take by mouth.  . hydrochlorothiazide (HYDRODIURIL) 25 MG tablet Take 25 mg by mouth daily.  . hydrocortisone 2.5 % cream Apply topically as needed.  . metoprolol succinate (TOPROL-XL) 50 MG 24 hr tablet TAKE 1 TABLET (50 MG TOTAL) BY MOUTH DAILY. TAKE WITH OR IMMEDIATELY FOLLOWING A MEAL.  Marland Kitchen olmesartan (BENICAR) 40 MG tablet Take 40 mg by mouth daily.  . pravastatin (PRAVACHOL) 40 MG tablet TAKE 1 TABLET BY MOUTH EVERY DAY  . [DISCONTINUED] cyclobenzaprine (FLEXERIL) 5 MG tablet Take 1-2 tablets (5-10 mg total) by mouth 3 (three) times daily as needed for muscle spasms.  . [DISCONTINUED] losartan-hydrochlorothiazide (HYZAAR) 100-25 MG tablet Take 1 tablet by mouth daily. (Patient not taking: Reported on 01/01/2019)   No facility-administered encounter medications on file as of 01/01/2019.     Activities of Daily Living In your present state of health, do you have any difficulty performing the following activities: 01/01/2019  Hearing? N  Vision? N  Difficulty concentrating or making decisions? N  Walking or climbing stairs? N  Dressing or bathing? N  Doing errands, shopping? N  Preparing Food and eating ? N  Using the Toilet? N  In the past six months, have you accidently leaked urine? N  Do you have problems with loss of bowel control? N  Managing your Medications? N  Managing your Finances? N  Housekeeping or managing your Housekeeping? N  Some recent data might be hidden    Patient Care Team: Venita Lick, NP as PCP - General (Nurse Practitioner) Dionisio David, MD as Consulting Physician (Cardiology) Idelle Leech, OD (Optometry)    Assessment:   This is a routine wellness examination for  St[place].  Exercise Activities and Dietary recommendations Current Exercise Habits: Home exercise routine, Type of exercise: walking, Time (Minutes): 30, Frequency (Times/Week): 4, Weekly Exercise (Minutes/Week): 120, Intensity: Mild, Exercise limited by: None identified  Goals    . DIET - INCREASE WATER INTAKE     Recommend drinking at least 6-8 glasses of water a day        Fall Risk: Fall Risk  01/01/2019 11/09/2017 10/19/2016 09/16/2015  Falls in the past year? 0 No No Yes  Number falls in past yr: - - - 1  Injury with Fall? - - - No    FALL RISK PREVENTION PERTAINING TO THE HOME:  Any stairs in or around the home? No  If so, are  there any without handrails? n/a  Home free of loose throw rugs in walkways, pet beds, electrical cords, etc? Yes  Adequate lighting in your home to reduce risk of falls? Yes   ASSISTIVE DEVICES UTILIZED TO PREVENT FALLS:  Life alert? No  Use of a cane, walker or w/c? No  Grab bars in the bathroom? No  Shower chair or bench in shower? Yes  Elevated toilet seat or a handicapped toilet? No   DME ORDERS:  DME order needed?  No   TIMED UP AND GO:  Unable to perform    Depression Screen PHQ 2/9 Scores 01/01/2019 11/09/2017 10/19/2016 09/16/2015  PHQ - 2 Score 0 0 0 0  PHQ- 9 Score - - 1 -     Cognitive Function     6CIT Screen 01/01/2019 11/09/2017  What Year? 0 points 0 points  What month? 0 points 0 points  What time? 0 points 0 points  Count back from 20 0 points 0 points  Months in reverse 0 points 0 points  Repeat phrase 0 points 0 points  Total Score 0 0    Immunization History  Administered Date(s) Administered  . Influenza, High Dose Seasonal PF 05/31/2018  . Pneumococcal Conjugate-13 08/13/2014  . Pneumococcal Polysaccharide-23 04/06/2011  . Td 03/20/2003  . Zoster 04/04/2007  . Zoster Recombinat (Shingrix) 11/21/2017    Qualifies for Shingles Vaccine? Yes  completed shingrix series Due for Shingrix. Education has been  provided regarding the importance of this vaccine. Pt has been advised to call insurance company to determine out of pocket expense. Advised may also receive vaccine at local pharmacy or Health Dept. Verbalized acceptance and understanding.  Tdap: Although this vaccine is not a covered service during a Wellness Exam, does the patient still wish to receive this vaccine today?  No .  Education has been provided regarding the importance of this vaccine. Advised may receive this vaccine at local pharmacy or Health Dept. Aware to provide a copy of the vaccination record if obtained from local pharmacy or Health Dept. Verbalized acceptance and understanding.  Flu Vaccine: up to date   Pneumococcal Vaccine:  Up to date   Screening Tests Health Maintenance  Topic Date Due  . TETANUS/TDAP  01/01/2020 (Originally 03/19/2013)  . INFLUENZA VACCINE  03/17/2019  . COLONOSCOPY  10/24/2019  . MAMMOGRAM  01/18/2020  . DEXA SCAN  Completed  . Hepatitis C Screening  Completed  . PNA vac Low Risk Adult  Completed    Cancer Screenings:  Colorectal Screening: Completed 10/24/2014 Repeat every 5 years  Mammogram: Completed 01/17/2018. Repeat every year  Bone Density: Completed 10/06/2015  Lung Cancer Screening: (Low Dose CT Chest recommended if Age 62-80 years, 30 pack-year currently smoking OR have quit w/in 15years.) does not qualify.     Additional Screening:  Hepatitis C Screening: does qualify; Completed 09/16/2015  Vision Screening: Recommended annual ophthalmology exams for early detection of glaucoma and other disorders of the eye. Is the patient up to date with their annual eye exam?  Yes  Who is the provider or what is the name of the office in which the pt attends annual eye exams? Dr.Nice   Dental Screening: Recommended annual dental exams for proper oral hygiene  Community Resource Referral:  CRR required this visit?  No       Plan:  I have personally reviewed and addressed the  Medicare Annual Wellness questionnaire and have noted the following in the patient's chart:  A. Medical and social  history B. Use of alcohol, tobacco or illicit drugs  C. Current medications and supplements D. Functional ability and status E.  Nutritional status F.  Physical activity G. Advance directives H. List of other physicians I.  Hospitalizations, surgeries, and ER visits in previous 12 months J.  [place] such as hearing and vision if needed, cognitive and depression L. Referrals and appointments   In addition, I have reviewed and discussed with patient certain preventive protocols, quality metrics, and best practice recommendations. A written personalized care plan for preventive services as well as general preventive health recommendations were provided to patient. Nurse Health Advisor  Signed,    [place], Caro Hight, [place]  11/21/6806 Nurse Health Advisor   Nurse Notes: none

## 2019-01-01 NOTE — Patient Instructions (Signed)
Denise Morris , Thank you for taking time to come for your Medicare Wellness Visit. I appreciate your ongoing commitment to your health goals. Please review the following plan we discussed and let me know if I can assist you in the future.   Screening recommendations/referrals: Colonoscopy: 10/24/2014, due 10/2019 Mammogram: completed 01/17/2018, due 01/2019 Bone Density: completed 10/06/2015 Recommended yearly ophthalmology/optometry visit for glaucoma screening and checkup Recommended yearly dental visit for hygiene and checkup  Vaccinations: Influenza vaccine: up to date Pneumococcal vaccine: up to date Tdap vaccine: due, check with your insurance company for coverage information Shingles vaccine: completed series    Advanced directives: Please bring a copy of your health care power of attorney and living will to the office at your convenience.  Conditions/risks identified: increase water intake  Next appointment: follow up in one year for your annual wellness exam.    Preventive Care 75 Years and Older, Female Preventive care refers to lifestyle choices and visits with your health care provider that can promote health and wellness. What does preventive care include?  A yearly physical exam. This is also called an annual well check.  Dental exams once or twice a year.  Routine eye exams. Ask your health care provider how often you should have your eyes checked.  Personal lifestyle choices, including:  Daily care of your teeth and gums.  Regular physical activity.  Eating a healthy diet.  Avoiding tobacco and drug use.  Limiting alcohol use.  Practicing safe sex.  Taking low-dose aspirin every day.  Taking vitamin and mineral supplements as recommended by your health care provider. What happens during an annual well check? The services and screenings done by your health care provider during your annual well check will depend on your age, overall health, lifestyle risk  factors, and family history of disease. Counseling  Your health care provider may ask you questions about your:  Alcohol use.  Tobacco use.  Drug use.  Emotional well-being.  Home and relationship well-being.  Sexual activity.  Eating habits.  History of falls.  Memory and ability to understand (cognition).  Work and work Statistician.  Reproductive health. Screening  You may have the following tests or measurements:  Height, weight, and BMI.  Blood pressure.  Lipid and cholesterol levels. These may be checked every 5 years, or more frequently if you are over 49 years old.  Skin check.  Lung cancer screening. You may have this screening every year starting at age 43 if you have a 30-pack-year history of smoking and currently smoke or have quit within the past 15 years.  Fecal occult blood test (FOBT) of the stool. You may have this test every year starting at age 34.  Flexible sigmoidoscopy or colonoscopy. You may have a sigmoidoscopy every 5 years or a colonoscopy every 10 years starting at age 26.  Hepatitis C blood test.  Hepatitis B blood test.  Sexually transmitted disease (STD) testing.  Diabetes screening. This is done by checking your blood sugar (glucose) after you have not eaten for a while (fasting). You may have this done every 1-3 years.  Bone density scan. This is done to screen for osteoporosis. You may have this done starting at age 41.  Mammogram. This may be done every 1-2 years. Talk to your health care provider about how often you should have regular mammograms. Talk with your health care provider about your test results, treatment options, and if necessary, the need for more tests. Vaccines  Your health care  provider may recommend certain vaccines, such as:  Influenza vaccine. This is recommended every year.  Tetanus, diphtheria, and acellular pertussis (Tdap, Td) vaccine. You may need a Td booster every 10 years.  Zoster vaccine. You  may need this after age 6.  Pneumococcal 13-valent conjugate (PCV13) vaccine. One dose is recommended after age 63.  Pneumococcal polysaccharide (PPSV23) vaccine. One dose is recommended after age 71. Talk to your health care provider about which screenings and vaccines you need and how often you need them. This information is not intended to replace advice given to you by your health care provider. Make sure you discuss any questions you have with your health care provider. Document Released: 08/29/2015 Document Revised: 04/21/2016 Document Reviewed: 06/03/2015 Elsevier Interactive Patient Education  2017 [employer] Prevention in the Home Falls can cause injuries. They can happen to people of all ages. There are many things you can do to make your home safe and to help prevent falls. What can I do on the outside of my home?  Regularly fix the edges of walkways and driveways and fix any cracks.  Remove anything that might make you trip as you walk through a door, such as a raised step or threshold.  Trim any bushes or trees on the path to your home.  Use bright outdoor lighting.  Clear any walking paths of anything that might make someone trip, such as rocks or tools.  Regularly check to see if handrails are loose or broken. Make sure that both sides of any steps have handrails.  Any raised decks and porches should have guardrails on the edges.  Have any leaves, snow, or ice cleared regularly.  Use sand or salt on walking paths during winter.  Clean up any spills in your garage right away. This includes oil or grease spills. What can I do in the bathroom?  Use night lights.  Install grab bars by the toilet and in the tub and shower. Do not use towel bars as grab bars.  Use non-skid mats or decals in the tub or shower.  If you need to sit down in the shower, use a plastic, non-slip stool.  Keep the floor dry. Clean up any water that spills on the floor as soon as  it happens.  Remove soap buildup in the tub or shower regularly.  Attach bath mats securely with double-sided non-slip rug tape.  Do not have throw rugs and other things on the floor that can make you trip. What can I do in the bedroom?  Use night lights.  Make sure that you have a light by your bed that is easy to reach.  Do not use any sheets or blankets that are too big for your bed. They should not hang down onto the floor.  Have a firm chair that has side arms. You can use this for support while you get dressed.  Do not have throw rugs and other things on the floor that can make you trip. What can I do in the kitchen?  Clean up any spills right away.  Avoid walking on wet floors.  Keep items that you use a lot in easy-to-reach places.  If you need to reach something above you, use a strong step stool that has a grab bar.  Keep electrical cords out of the way.  Do not use floor polish or wax that makes floors slippery. If you must use wax, use non-skid floor wax.  Do not have throw  rugs and other things on the floor that can make you trip. What can I do with my stairs?  Do not leave any items on the stairs.  Make sure that there are handrails on both sides of the stairs and use them. Fix handrails that are broken or loose. Make sure that handrails are as long as the stairways.  Check any carpeting to make sure that it is firmly attached to the stairs. Fix any carpet that is loose or worn.  Avoid having throw rugs at the top or bottom of the stairs. If you do have throw rugs, attach them to the floor with carpet tape.  Make sure that you have a light switch at the top of the stairs and the bottom of the stairs. If you do not have them, ask someone to add them for you. What else can I do to help prevent falls?  Wear shoes that:  Do not have high heels.  Have rubber bottoms.  Are comfortable and fit you well.  Are closed at the toe. Do not wear sandals.  If  you use a stepladder:  Make sure that it is fully opened. Do not climb a closed stepladder.  Make sure that both sides of the stepladder are locked into place.  Ask someone to hold it for you, if possible.  Clearly mark and make sure that you can see:  Any grab bars or handrails.  First and last steps.  Where the edge of each step is.  Use tools that help you move around (mobility aids) if they are needed. These include:  Canes.  Walkers.  Scooters.  Crutches.  Turn on the lights when you go into a dark area. Replace any light bulbs as soon as they burn out.  Set up your furniture so you have a clear path. Avoid moving your furniture around.  If any of your floors are uneven, fix them.  If there are any pets around you, be aware of where they are.  Review your medicines with your doctor. Some medicines can make you feel dizzy. This can increase your chance of falling. Ask your doctor what other things that you can do to help prevent falls. This information is not intended to replace advice given to you by your health care provider. Make sure you discuss any questions you have with your health care provider. Document Released: 05/29/2009 Document Revised: 01/08/2016 Document Reviewed: 09/06/2014 Elsevier Interactive Patient Education  2017 [employer].

## 2019-03-08 ENCOUNTER — Other Ambulatory Visit: Payer: Self-pay | Admitting: Nurse Practitioner

## 2019-03-08 NOTE — Telephone Encounter (Signed)
Forwarding medication refill to PCP for review. 

## 2019-03-20 ENCOUNTER — Ambulatory Visit (INDEPENDENT_AMBULATORY_CARE_PROVIDER_SITE_OTHER): Payer: Medicare Other | Admitting: Nurse Practitioner

## 2019-03-20 ENCOUNTER — Other Ambulatory Visit: Payer: Self-pay

## 2019-03-20 ENCOUNTER — Encounter: Payer: Self-pay | Admitting: Nurse Practitioner

## 2019-03-20 VITALS — BP 115/71 | HR 58 | Temp 98.5°F | Ht 66.0 in | Wt 198.0 lb

## 2019-03-20 DIAGNOSIS — M85852 Other specified disorders of bone density and structure, left thigh: Secondary | ICD-10-CM

## 2019-03-20 DIAGNOSIS — R7303 Prediabetes: Secondary | ICD-10-CM

## 2019-03-20 DIAGNOSIS — Z0000 Encounter for general adult medical examination without abnormal findings: Secondary | ICD-10-CM | POA: Diagnosis not present

## 2019-03-20 DIAGNOSIS — M85851 Other specified disorders of bone density and structure, right thigh: Secondary | ICD-10-CM

## 2019-03-20 DIAGNOSIS — I10 Essential (primary) hypertension: Secondary | ICD-10-CM

## 2019-03-20 DIAGNOSIS — E782 Mixed hyperlipidemia: Secondary | ICD-10-CM

## 2019-03-20 MED ORDER — HYDROCORTISONE 2.5 % EX CREA: TOPICAL_CREAM | CUTANEOUS | 2 refills | Status: DC | PRN

## 2019-03-20 NOTE — Patient Instructions (Signed)

## 2019-03-20 NOTE — Assessment & Plan Note (Addendum)
Next Dexa due 2022.  Continue supplements.

## 2019-03-20 NOTE — Assessment & Plan Note (Signed)
Chronic, stable with BP below goal.  Will trial HCTZ at 12.5 MG daily yo assist with urinary symptoms.  Continue remainder of regimen and collaboration with cardiology.  Labs today.

## 2019-03-20 NOTE — Assessment & Plan Note (Signed)
Chronic, ongoing.  Continue current medication regimen and adjust as needed.  Labs today. 

## 2019-03-20 NOTE — Progress Notes (Signed)
BP 115/71   Pulse (!) 58   Temp 98.5 F (36.9 C) (Oral)   Ht 5\' 6"  (1.676 m)   Wt 198 lb (89.8 kg)   LMP  (LMP Unknown)   SpO2 97%   BMI 31.96 kg/m    Subjective:    Patient ID: Denise Morris, female    DOB: Feb 25, 1946, 73 y.o.   MRN: [id number]  HPI: Denise Morris is a 73 y.o. female presenting on 03/20/2019 for comprehensive medical examination. Current medical complaints include:none  She currently lives with: self Menopausal Symptoms: no   HYPERTENSION / HYPERLIPIDEMIA Continues on HCTZ, Metoprolol, Benicar, and Pravastatin. Last saw Dr. Humphrey Rolls in May 2020 and EF 68%.  She reports with the diuretic pill she has to get up to bathroom often in middle of the night and rush to go.  We discussed going down to 1/2 a pill due to well-controlled BP.  She takes it in the morning. Satisfied with current treatment? yes Duration of hypertension: chronic BP monitoring frequency: weekly BP range: 110/70's on average at home BP medication side effects: no Duration of hyperlipidemia: chronic Cholesterol medication side effects: no Cholesterol supplements: none Medication compliance: good compliance Aspirin: yes Recent stressors: no Recurrent headaches: no Visual changes: no Palpitations: no Dyspnea: no Chest pain: no Lower extremity edema: yes, at baseline Dizzy/lightheaded: no   IFG: No current medications.  Last A1C in October 6.1%.   Hypoglycemic episodes:no Polydipsia/polyuria: no Visual disturbance: no Chest pain: no Paresthesias: no Aspirin: yes   OSTEOPENIA: Last bone density in 2017, with normal findings.  Next scan due in 2022  Depression Screen done today and results listed below:  Depression screen [facility] 2/9 03/20/2019 01/01/2019 11/09/2017 10/19/2016 09/16/2015  Decreased Interest 0 0 0 0 0  Down, Depressed, Hopeless 0 0 0 0 0  PHQ - 2 Score 0 0 0 0 0  Altered sleeping 1 - - 1 -  Tired, decreased energy 0 - - 0 -  Change in appetite 1 - - 0 -  Feeling bad or  failure about yourself  0 - - 0 -  Trouble concentrating 0 - - 0 -  Moving slowly or fidgety/restless 0 - - 0 -  Suicidal thoughts 0 - - 0 -  PHQ-9 Score 2 - - 1 -  Difficult doing work/chores Not difficult at all - - - -    The patient does not have a history of falls. I did not complete a risk assessment for falls. A plan of care for falls was not documented.   Past Medical History:  Past Medical History:  Diagnosis Date  . Anxiety   . Depression   . Edema   . Heart palpitations   . Hyperlipidemia   . Hypertension   . Menopausal state   . Osteopenia     Surgical History:  Past Surgical History:  Procedure Laterality Date  . ABDOMINAL HYSTERECTOMY  1978   Partial ( Precancerous cells)    Medications:  Current Outpatient Medications on File Prior to Visit  Medication Sig  . aspirin 81 MG tablet Take 81 mg by mouth daily.  . Calcium Carbonate-Vitamin D (CALCIUM HIGH POTENCY/VITAMIN D) 600-200 MG-UNIT TABS Take by mouth.  . hydrochlorothiazide (HYDRODIURIL) 25 MG tablet Take 25 mg by mouth daily.  . metoprolol succinate (TOPROL-XL) 50 MG 24 hr tablet TAKE 1 TABLET (50 MG TOTAL) BY MOUTH DAILY. TAKE WITH OR IMMEDIATELY FOLLOWING A MEAL.  Marland Kitchen olmesartan (BENICAR) 40 MG tablet Take  40 mg by mouth daily.  . pravastatin (PRAVACHOL) 40 MG tablet TAKE 1 TABLET BY MOUTH EVERY DAY   No current facility-administered medications on file prior to visit.     Allergies:  Allergies  Allergen Reactions  . Citalopram Hydrobromide Nausea And Vomiting  . Penicillins   . Zestril [Lisinopril] Cough    Social History:  Social History   Socioeconomic History  . Marital status: Widowed    Spouse name: Not on file  . Number of children: Not on file  . Years of education: Not on file  . Highest education level: Master's degree (e.g., MA, MS, MEng, MEd, MSW, MBA)  Occupational History  . Occupation: retired  Scientific laboratory technician  . Financial resource strain: Not hard at all  . Food  insecurity    Worry: Never true    Inability: Never true  . Transportation needs    Medical: No    Non-medical: No  Tobacco Use  . Smoking status: Never Smoker  . Smokeless tobacco: Never Used  Substance and Sexual Activity  . Alcohol use: No  . Drug use: No  . Sexual activity: Yes  Lifestyle  . Physical activity    Days per week: 4 days    Minutes per session: 30 min  . Stress: Not at all  Relationships  . Social connections    Talks on phone: More than three times a week    Gets together: More than three times a week    Attends religious service: More than 4 times per year    Active member of club or organization: Yes    Attends meetings of clubs or organizations: More than 4 times per year    Relationship status: Married  . Intimate partner violence    Fear of current or ex partner: No    Emotionally abused: No    Physically abused: No    Forced sexual activity: No  Other Topics Concern  . Not on file  Social History Narrative   [employer]- meet monthly   [employer]- meet monthly   Social History   Tobacco Use  Smoking Status Never Smoker  Smokeless Tobacco Never Used   Social History   Substance and Sexual Activity  Alcohol Use No    Family History:  Family History  Problem Relation Age of Onset  . Alzheimer's disease Mother   . Hypertension Father   . Hypertension Sister   . Hyperlipidemia Sister   . Breast cancer Neg Hx     Past medical history, surgical history, medications, allergies, family history and social history reviewed with patient today and changes made to appropriate areas of the chart.   Review of Systems - negative All other ROS negative except what is listed above and in the HPI.      Objective:    BP 115/71   Pulse (!) 58   Temp 98.5 F (36.9 C) (Oral)   Ht 5\' 6"  (1.676 m)   Wt 198 lb (89.8 kg)   LMP  (LMP Unknown)   SpO2 97%   BMI 31.96 kg/m   Wt Readings from Last 3 Encounters:  03/20/19 198 lb (89.8 kg)  01/01/19 195 lb  (88.5 kg)  10/22/18 186 lb (84.4 kg)    Physical Exam Vitals signs and nursing note reviewed.  Constitutional:      General: She is awake. She is not in acute distress.    Appearance: She is well-developed. She is not ill-appearing.  HENT:  Head: Normocephalic.     Right Ear: Hearing, tympanic membrane, ear canal and external ear normal.     Left Ear: Hearing, tympanic membrane, ear canal and external ear normal.     Nose: Nose normal.     Mouth/Throat:     Mouth: Mucous membranes are moist.     Pharynx: Oropharynx is clear.  Eyes:     General: Lids are normal.        Right eye: No discharge.        Left eye: No discharge.     Extraocular Movements: Extraocular movements intact.     Conjunctiva/sclera: Conjunctivae normal.     Pupils: Pupils are equal, round, and reactive to light.     Visual Fields: Right eye visual fields normal and left eye visual fields normal.  Neck:     Musculoskeletal: Normal range of motion and neck supple.     Thyroid: No thyromegaly.     Vascular: No carotid bruit.  Cardiovascular:     Rate and Rhythm: Normal rate and regular rhythm.     Heart sounds: Normal heart sounds. No murmur. No gallop.   Pulmonary:     Effort: Pulmonary effort is normal. No accessory muscle usage or respiratory distress.     Breath sounds: Normal breath sounds.  Chest:     Breasts:        Right: Normal. No swelling, bleeding, inverted nipple, mass, nipple discharge, skin change or tenderness.        Left: Normal. No swelling, bleeding, inverted nipple, mass, nipple discharge, skin change or tenderness.  Abdominal:     General: Bowel sounds are normal.     Palpations: Abdomen is soft. There is no hepatomegaly or splenomegaly.     Tenderness: There is no abdominal tenderness.  Musculoskeletal:     Right lower leg: 1+ Edema present.     Left lower leg: 1+ Edema present.  Lymphadenopathy:     Cervical: No cervical adenopathy.     Upper Body:     Right upper body: No  supraclavicular, axillary or pectoral adenopathy.     Left upper body: No supraclavicular, axillary or pectoral adenopathy.  Skin:    General: Skin is warm and dry.  Neurological:     Mental Status: She is alert and oriented to person, place, and time.     Deep Tendon Reflexes: Reflexes are normal and symmetric.     Reflex Scores:      Brachioradialis reflexes are 2+ on the right side and 2+ on the left side.      Patellar reflexes are 2+ on the right side and 2+ on the left side. Psychiatric:        Attention and Perception: Attention normal.        Mood and Affect: Mood normal.        Speech: Speech normal.        Behavior: Behavior normal. Behavior is cooperative.        Thought Content: Thought content normal.        Cognition and Memory: Cognition normal.        Judgment: Judgment normal.     Results for orders placed or performed in visit on 05/31/18  Bayer DCA Hb A1c Waived (STAT)  Result Value Ref Range   HB A1C (BAYER DCA - WAIVED) 6.1 <7.0 %  Lipid Profile  Result Value Ref Range   Cholesterol, Total 167 100 - 199 mg/dL   Triglycerides 90 0 - 149  mg/dL   HDL 45 >39 mg/dL   VLDL Cholesterol Cal 18 5 - 40 mg/dL   LDL Calculated 104 (H) 0 - 99 mg/dL   Chol/HDL Ratio 3.7 0.0 - 4.4 ratio  CBC  Result Value Ref Range   WBC 5.5 3.4 - 10.8 x10E3/uL   RBC 4.98 3.77 - 5.28 x10E6/uL   Hemoglobin 14.3 11.1 - 15.9 g/dL   Hematocrit 44.0 34.0 - 46.6 %   MCV 88 79 - 97 fL   MCH 28.7 26.6 - 33.0 pg   MCHC 32.5 31.5 - 35.7 g/dL   RDW 13.1 12.3 - 15.4 %   Platelets 186 150 - 450 x10E3/uL      Assessment & Plan:   Problem List Items Addressed This Visit      Cardiovascular and Mediastinum   Hypertension    Chronic, stable with BP below goal.  Will trial HCTZ at 12.5 MG daily yo assist with urinary symptoms.  Continue remainder of regimen and collaboration with cardiology.  Labs today.        Relevant Orders   CBC with Differential/Platelet   Comprehensive metabolic  panel     Musculoskeletal and Integument   Osteopenia    Next Dexa due 2022.  Continue supplements.        Other   Hyperlipidemia    Chronic, ongoing.  Continue current medication regimen and adjust as needed.  Labs today.        Relevant Orders   Comprehensive metabolic panel   Lipid Panel w/o Chol/HDL Ratio   Prediabetes    Check A1C today and adjust plan of care as needed.      Relevant Orders   HgB A1c    Other Visit Diagnoses    Encounter for annual physical exam    -  Primary   Relevant Orders   TSH       Follow up plan: Return in about 6 months (around 09/20/2019) for HTN/HLD.   LABORATORY TESTING:  - Pap smear: not applicable  IMMUNIZATIONS:   - Tdap: Tetanus vaccination status reviewed: last tetanus booster within 10 years. - Influenza: Up to date - Pneumovax: Up to date - Prevnar: Up to date - HPV: Not applicable - Zostavax vaccine: Up to date  SCREENING: -Mammogram: Up to date  - Colonoscopy: Up to date  - Bone Density: Up to date  -Hearing Test: Not applicable  -Spirometry: Not applicable   PATIENT COUNSELING:   Advised to take 1 mg of folate supplement per day if capable of pregnancy.   Sexuality: Discussed sexually transmitted diseases, partner selection, use of condoms, avoidance of unintended pregnancy  and contraceptive alternatives.   Advised to avoid cigarette smoking.  I discussed with the patient that most people either abstain from alcohol or drink within safe limits (<=14/week and <=4 drinks/occasion for males, <=7/weeks and <= 3 drinks/occasion for females) and that the risk for alcohol disorders and other health effects rises proportionally with the number of drinks per week and how often a drinker exceeds daily limits.  Discussed cessation/primary prevention of drug use and availability of treatment for abuse.   Diet: Encouraged to adjust caloric intake to maintain  or achieve ideal body weight, to reduce intake of dietary  saturated fat and total fat, to limit sodium intake by avoiding high sodium foods and not adding table salt, and to maintain adequate dietary potassium and calcium preferably from fresh fruits, vegetables, and low-fat dairy products.    stressed the importance of  regular exercise  Injury prevention: Discussed safety belts, safety helmets, smoke detector, smoking near bedding or upholstery.   Dental health: Discussed importance of regular tooth brushing, flossing, and dental visits.    NEXT PREVENTATIVE PHYSICAL DUE IN 1 YEAR. Return in about 6 months (around 09/20/2019) for HTN/HLD.

## 2019-03-20 NOTE — Assessment & Plan Note (Signed)
Check A1C today and adjust plan of care as needed.

## 2019-03-21 LAB — COMPREHENSIVE METABOLIC PANEL
ALT: 11 IU/L (ref 0–32)
AST: 19 IU/L (ref 0–40)
Albumin/Globulin Ratio: 1.6 (ref 1.2–2.2)
Albumin: 3.9 g/dL (ref 3.7–4.7)
Alkaline Phosphatase: 72 IU/L (ref 39–117)
BUN/Creatinine Ratio: 19 (ref 12–28)
BUN: 16 mg/dL (ref 8–27)
Bilirubin Total: 0.4 mg/dL (ref 0.0–1.2)
CO2: 22 mmol/L (ref 20–29)
Calcium: 8.9 mg/dL (ref 8.7–10.3)
Chloride: 104 mmol/L (ref 96–106)
Creatinine, Ser: 0.85 mg/dL (ref 0.57–1.00)
GFR calc Af Amer: 78 mL/min/{1.73_m2} (ref >59)
GFR calc non Af Amer: 67 mL/min/{1.73_m2} (ref >59)
Globulin, Total: 2.4 g/dL (ref 1.5–4.5)
Glucose: 93 mg/dL (ref 65–99)
Potassium: 3.7 mmol/L (ref 3.5–5.2)
Sodium: 143 mmol/L (ref 134–144)
Total Protein: 6.3 g/dL (ref 6.0–8.5)

## 2019-03-21 LAB — CBC WITH DIFFERENTIAL/PLATELET
Basophils Absolute: 0.1 10*3/uL (ref 0.0–0.2)
Basos: 1 %
EOS (ABSOLUTE): 0.5 10*3/uL — ABNORMAL HIGH (ref 0.0–0.4)
Eos: 8 %
Hematocrit: 40.6 % (ref 34.0–46.6)
Hemoglobin: 13.8 g/dL (ref 11.1–15.9)
Immature Grans (Abs): 0 10*3/uL (ref 0.0–0.1)
Immature Granulocytes: 0 %
Lymphocytes Absolute: 2.3 10*3/uL (ref 0.7–3.1)
Lymphs: 42 %
MCH: 30.1 pg (ref 26.6–33.0)
MCHC: 34 g/dL (ref 31.5–35.7)
MCV: 89 fL (ref 79–97)
Monocytes Absolute: 0.4 10*3/uL (ref 0.1–0.9)
Monocytes: 7 %
Neutrophils Absolute: 2.2 10*3/uL (ref 1.4–7.0)
Neutrophils: 42 %
Platelets: 146 10*3/uL — ABNORMAL LOW (ref 150–450)
RBC: 4.58 x10E6/uL (ref 3.77–5.28)
RDW: 12.6 % (ref 11.7–15.4)
WBC: 5.4 10*3/uL (ref 3.4–10.8)

## 2019-03-21 LAB — HEMOGLOBIN A1C
Est. average glucose Bld gHb Est-mCnc: 120 mg/dL
Hgb A1c MFr Bld: 5.8 % — ABNORMAL HIGH (ref 4.8–5.6)

## 2019-03-21 LAB — LIPID PANEL W/O CHOL/HDL RATIO
Cholesterol, Total: 159 mg/dL (ref 100–199)
HDL: 52 mg/dL (ref >39)
LDL Calculated: 88 mg/dL (ref 0–99)
Triglycerides: 96 mg/dL (ref 0–149)
VLDL Cholesterol Cal: 19 mg/dL (ref 5–40)

## 2019-03-21 LAB — TSH: TSH: 0.988 u[IU]/mL (ref 0.450–4.500)

## 2019-03-26 ENCOUNTER — Other Ambulatory Visit: Payer: Self-pay | Admitting: Nurse Practitioner

## 2019-03-26 DIAGNOSIS — Z1231 Encounter for screening mammogram for malignant neoplasm of breast: Secondary | ICD-10-CM

## 2019-03-28 ENCOUNTER — Ambulatory Visit
Admission: RE | Admit: 2019-03-28 | Discharge: 2019-03-28 | Disposition: A | Discharge status: Home / Self Care | Payer: Medicare Other | Source: Ambulatory Visit | Attending: Nurse Practitioner | Admitting: Nurse Practitioner

## 2019-03-28 DIAGNOSIS — Z1231 Encounter for screening mammogram for malignant neoplasm of breast: Secondary | ICD-10-CM | POA: Diagnosis not present

## 2019-04-05 NOTE — Addendum Note (Signed)
Addended by: Marnee Guarneri T on: 04/05/2019 04:45 PM   Modules accepted: Level of Service

## 2019-05-01 DIAGNOSIS — E785 Hyperlipidemia, unspecified: Secondary | ICD-10-CM | POA: Diagnosis not present

## 2019-05-01 DIAGNOSIS — R079 Chest pain, unspecified: Secondary | ICD-10-CM | POA: Diagnosis not present

## 2019-05-01 DIAGNOSIS — I10 Essential (primary) hypertension: Secondary | ICD-10-CM | POA: Diagnosis not present

## 2019-05-12 ENCOUNTER — Other Ambulatory Visit: Payer: Self-pay

## 2019-05-12 DIAGNOSIS — Z20822 Contact with and (suspected) exposure to covid-19: Secondary | ICD-10-CM

## 2019-05-13 LAB — NOVEL CORONAVIRUS, NAA: SARS-CoV-2, NAA: NOT DETECTED

## 2019-05-17 ENCOUNTER — Telehealth: Payer: Self-pay | Admitting: Nurse Practitioner

## 2019-05-17 NOTE — Chronic Care Management (AMB) (Signed)
Chronic Care Management   Note  05/17/2019 Name: Denise Morris MRN: [id number] DOB: May 07, 1946  Denise Morris is a 73 y.o. year old female who is a primary care patient of Cannady, Barbaraann Faster, NP. I reached out to Denise Morris by phone today in response to a referral sent by Ms. Rhegan B Ayer's health plan.     Denise Morris was given information about Chronic Care Management services today including:  1. CCM service includes personalized support from designated clinical staff supervised by her physician, including individualized plan of care and coordination with other care providers 2. 24/7 contact phone numbers for assistance for urgent and routine care needs. 3. Service will only be billed when office clinical staff spend 20 minutes or more in a month to coordinate care. 4. Only one practitioner may furnish and bill the service in a calendar month. 5. The patient may stop CCM services at any time (effective at the end of the month) by phone call to the office staff. 6. The patient will be responsible for cost sharing (co-pay) of up to 20% of the service fee (after annual deductible is met).  Patient agreed to services and verbal consent obtained.   Follow up plan: Telephone appointment with CCM team member scheduled for: 06/19/2019  Mucarabones  [email or web address]   ??[phone]

## 2019-05-23 ENCOUNTER — Other Ambulatory Visit: Payer: Self-pay | Admitting: Unknown Physician Specialty

## 2019-06-04 ENCOUNTER — Ambulatory Visit (INDEPENDENT_AMBULATORY_CARE_PROVIDER_SITE_OTHER): Payer: Medicare Other

## 2019-06-04 ENCOUNTER — Other Ambulatory Visit: Payer: Self-pay

## 2019-06-04 DIAGNOSIS — Z23 Encounter for immunization: Secondary | ICD-10-CM

## 2019-06-19 ENCOUNTER — Telehealth: Payer: Federal, State, Local not specified - PPO

## 2019-07-20 ENCOUNTER — Ambulatory Visit (INDEPENDENT_AMBULATORY_CARE_PROVIDER_SITE_OTHER): Payer: Medicare Other | Admitting: *Deleted

## 2019-07-20 DIAGNOSIS — I10 Essential (primary) hypertension: Secondary | ICD-10-CM

## 2019-07-20 DIAGNOSIS — E782 Mixed hyperlipidemia: Secondary | ICD-10-CM

## 2019-07-20 DIAGNOSIS — R7303 Prediabetes: Secondary | ICD-10-CM

## 2019-07-20 NOTE — Patient Instructions (Signed)
Thank you allowing the Chronic Care Management Team to be a part of your care! It was a pleasure speaking with you today!  CCM (Chronic Care Management) Team   Maryclaire Stoecker RN, BSN Nurse Care Coordinator  [phone]  [facility] PharmD  Clinical Pharmacist  [phone]  Eula Fried LCSW Clinical Social Worker [phone]  Goals Addressed            This Visit's Progress   . RN- Intro to CCM       Current Barriers:  . Chronic Disease Management support, education, and care coordination needs related to HTN and HLD  Clinical Goal(s) related to HTN:  Over the next 90 days, patient will:  . Work with the care management team to address educational, disease management, and care coordination needs  . Begin or continue self health monitoring activities as directed today Measure and record blood pressure 1 times daily . Call provider office for new or worsened signs and symptoms Blood pressure findings outside established parameters . Call care management team with questions or concerns . Verbalize basic understanding of patient centered plan of care established today . Exercise two times per week   Interventions related to HTN:  . Evaluation of current treatment plans and patient's adherence to plan as established by provider . Assessed patient understanding of disease states . Assessed patient's education and care coordination needs . Provided disease specific education to patient  . Patient would like to lose 13lbs to achieve this she is going to start walking with her sister 2 times per week.   Patient Self Care Activities related to HTN:  . Patient is unable to independently self-manage chronic health conditions  Initial goal documentation        The patient verbalized understanding of instructions provided today and declined a print copy of patient instruction materials.   The patient has been provided with contact information for the care management team  and has been advised to call with any health related questions or concerns.

## 2019-07-20 NOTE — Chronic Care Management (AMB) (Signed)
  Chronic Care Management   Initial Visit Note  07/20/2019 Name: ELLIEANA WAITERS MRN: [id number] DOB: 05-05-1946  Referred by: Venita Lick, NP Reason for referral : Chronic Care Management (Introduction to CCM )   MADIHA CISSEL is a 73 y.o. year old female who is a primary care patient of Cannady, Barbaraann Faster, NP. The CCM team was consulted for assistance with chronic disease management and care coordination needs.   Review of patient status, including review of consultants reports, relevant laboratory and other test results, and collaboration with appropriate care team members and the patient's provider was performed as part of comprehensive patient evaluation and provision of chronic care management services.    SDOH (Social Determinants of Health) screening performed today. See Care Plan Entry related to challenges with: Physical Activity  Objective:   Goals Addressed            This Visit's Progress   . RN- Intro to CCM       Current Barriers:  . Chronic Disease Management support, education, and care coordination needs related to HTN and HLD  Clinical Goal(s) related to HTN:  Over the next 90 days, patient will:  . Work with the care management team to address educational, disease management, and care coordination needs  . Begin or continue self health monitoring activities as directed today Measure and record blood pressure 1 times daily . Call provider office for new or worsened signs and symptoms Blood pressure findings outside established parameters . Call care management team with questions or concerns . Verbalize basic understanding of patient centered plan of care established today . Exercise two times per week   Interventions related to HTN:  . Evaluation of current treatment plans and patient's adherence to plan as established by provider . Assessed patient understanding of disease states . Assessed patient's education and care coordination needs . Provided  disease specific education to patient  . Patient would like to lose 13lbs to achieve this she is going to start walking with her sister 2 times per week.   Patient Self Care Activities related to HTN:  . Patient is unable to independently self-manage chronic health conditions  Initial goal documentation         Plan:   The care management team will reach out to the patient again over the next 30 days.  The patient has been provided with contact information for the care management team and has been advised to call with any health related questions or concerns.   Merlene Morse Shaneece Stockburger RN, BSN Nurse Case Pharmacist, [facility]  [phone]) [employer]

## 2019-07-26 ENCOUNTER — Other Ambulatory Visit: Payer: Self-pay

## 2019-07-26 DIAGNOSIS — Z20822 Contact with and (suspected) exposure to covid-19: Secondary | ICD-10-CM

## 2019-07-28 LAB — NOVEL CORONAVIRUS, NAA: SARS-CoV-2, NAA: NOT DETECTED

## 2019-07-28 LAB — SPECIMEN STATUS REPORT

## 2019-08-28 ENCOUNTER — Telehealth: Payer: Self-pay

## 2019-09-13 DIAGNOSIS — R002 Palpitations: Secondary | ICD-10-CM | POA: Diagnosis not present

## 2019-09-13 DIAGNOSIS — E785 Hyperlipidemia, unspecified: Secondary | ICD-10-CM | POA: Diagnosis not present

## 2019-09-13 DIAGNOSIS — R0602 Shortness of breath: Secondary | ICD-10-CM | POA: Diagnosis not present

## 2019-09-13 DIAGNOSIS — I509 Heart failure, unspecified: Secondary | ICD-10-CM | POA: Diagnosis not present

## 2019-09-13 DIAGNOSIS — I10 Essential (primary) hypertension: Secondary | ICD-10-CM | POA: Diagnosis not present

## 2019-09-18 ENCOUNTER — Telehealth: Payer: Self-pay

## 2019-09-25 ENCOUNTER — Ambulatory Visit: Payer: Federal, State, Local not specified - PPO | Admitting: Nurse Practitioner

## 2019-10-12 ENCOUNTER — Ambulatory Visit: Payer: Federal, State, Local not specified - PPO | Admitting: Nurse Practitioner

## 2019-10-12 ENCOUNTER — Encounter: Payer: Self-pay | Admitting: Nurse Practitioner

## 2019-10-12 ENCOUNTER — Telehealth (INDEPENDENT_AMBULATORY_CARE_PROVIDER_SITE_OTHER): Payer: Medicare Other | Admitting: Nurse Practitioner

## 2019-10-12 VITALS — BP 126/79 | Temp 97.3°F | Wt 205.0 lb

## 2019-10-12 DIAGNOSIS — R7303 Prediabetes: Secondary | ICD-10-CM | POA: Diagnosis not present

## 2019-10-12 DIAGNOSIS — I10 Essential (primary) hypertension: Secondary | ICD-10-CM | POA: Diagnosis not present

## 2019-10-12 DIAGNOSIS — E782 Mixed hyperlipidemia: Secondary | ICD-10-CM

## 2019-10-12 NOTE — Assessment & Plan Note (Addendum)
Continue diet focus, recheck A1C outpatient. Initiate medication as needed.

## 2019-10-12 NOTE — Progress Notes (Signed)
BP 126/79   Temp (!) 97.3 F (36.3 C) (Oral)   Wt 205 lb (93 kg)   LMP  (LMP Unknown)   BMI 33.09 kg/m    Subjective:    Patient ID: Denise Morris, female    DOB: 05/08/1946, 74 y.o.   MRN: [id number]  HPI: Denise Morris is a 74 y.o. female  Chief Complaint  Patient presents with  . Hyperlipidemia  . Hypertension    . This visit was completed via telephone due to the restrictions of the COVID-19 pandemic. All issues as above were discussed and addressed but no physical exam was performed. If it was felt that the patient should be evaluated in the office, they were directed there. The patient verbally consented to this visit. Patient was unable to complete an audio/visual visit due to Technical difficulties,Lack of internet. Due to the catastrophic nature of the COVID-19 pandemic, this visit was done through audio contact only. . Location of the patient: home . Location of the provider: work . Those involved with this call:  . Provider: Marnee Guarneri, DNP . CMA: Yvonna Alanis, CMA . Front Desk/Registration: Don Perking  . Time spent on call: 15 minutes on the phone discussing health concerns. 10 minutes total spent in review of patient's record and preparation of their chart.  . I verified patient identity using two factors (patient name and date of birth). Patient consents verbally to being seen via telemedicine visit today.    HYPERTENSION / HYPERLIPIDEMIA Continues on Olmesartan 40 MG, Metoprolol XL 50 MG, Hygroton 12.5 MG, and ASA.  Crestor 20 MG for HLD.  Followed by Dr. Humphrey Rolls with cardiology. Satisfied with current treatment? yes Duration of hypertension: chronic BP monitoring frequency: daily BP range: averages 120-130/70-80 BP medication side effects: no Duration of hyperlipidemia: chronic Cholesterol medication side effects: no Cholesterol supplements: none Medication compliance: good compliance Aspirin: yes Recent stressors: no Recurrent  headaches: no Visual changes: no Palpitations: no Dyspnea: no Chest pain: no Lower extremity edema: no Dizzy/lightheaded: no   PREDIABETES A1C in August was 5.8%.  Is focused on diet. Polydipsia/polyuria: no Visual disturbance: no Chest pain: no Paresthesias: no  Relevant past medical, surgical, family and social history reviewed and updated as indicated. Interim medical history since our last visit reviewed. Allergies and medications reviewed and updated.  Review of Systems  Constitutional: Negative for activity change, appetite change, diaphoresis, fatigue and fever.  Respiratory: Negative for cough, chest tightness, shortness of breath and wheezing.   Cardiovascular: Negative for chest pain, palpitations and leg swelling.  Gastrointestinal: Negative.   Endocrine: Negative for polydipsia, polyphagia and polyuria.  Neurological: Negative.   Psychiatric/Behavioral: Negative.     Per HPI unless specifically indicated above     Objective:    BP 126/79   Temp (!) 97.3 F (36.3 C) (Oral)   Wt 205 lb (93 kg)   LMP  (LMP Unknown)   BMI 33.09 kg/m   Wt Readings from Last 3 Encounters:  10/12/19 205 lb (93 kg)  03/20/19 198 lb (89.8 kg)  01/01/19 195 lb (88.5 kg)    Physical Exam   Unable to perform due to telephone visit only.  Results for orders placed or performed in visit on 07/26/19  Novel Coronavirus, NAA ([facility])   Specimen: Nasopharyngeal(NP) swabs in vial transport medium   NASOPHARYNGE  TESTING  Result Value Ref Range   SARS-CoV-2, NAA Not Detected Not Detected  Specimen status report  Result Value Ref Range   specimen status  report Comment       Assessment & Plan:   Problem List Items Addressed This Visit      Cardiovascular and Mediastinum   Hypertension - Primary    Chronic, stable with BP below goal.  Continue current medication regimen and collaboration with cardiology.  Continue to monitor BP at home regularly.  Obtain outpatient labs.       Relevant Medications   chlorthalidone (HYGROTON) 25 MG tablet   rosuvastatin (CRESTOR) 20 MG tablet   Other Relevant Orders   Comprehensive metabolic panel     Other   Hyperlipidemia    Chronic, ongoing.  Continue current medication regimen and adjust as needed.  Lipid panel outpatient.      Relevant Medications   chlorthalidone (HYGROTON) 25 MG tablet   rosuvastatin (CRESTOR) 20 MG tablet   Other Relevant Orders   Lipid Panel Piccolo, Waived   Prediabetes    Continue diet focus, recheck A1C outpatient. Initiate medication as needed.      Relevant Orders   Bayer DCA Hb A1c Waived       Follow up plan: Return in about 6 months (around 04/10/2020) for Annual physical.

## 2019-10-12 NOTE — Patient Instructions (Signed)

## 2019-10-12 NOTE — Assessment & Plan Note (Signed)
Chronic, stable with BP below goal.  Continue current medication regimen and collaboration with cardiology.  Continue to monitor BP at home regularly.  Obtain outpatient labs.

## 2019-10-12 NOTE — Assessment & Plan Note (Signed)
Chronic, ongoing.  Continue current medication regimen and adjust as needed.  Lipid panel outpatient. ?

## 2019-10-15 ENCOUNTER — Telehealth: Payer: Self-pay | Admitting: Nurse Practitioner

## 2019-10-15 ENCOUNTER — Encounter: Payer: Self-pay | Admitting: Nurse Practitioner

## 2019-10-15 NOTE — Telephone Encounter (Signed)
LVM for pt to call back and schedule 6 mo fu. This appt is to be scheduled as a OV as pt has Medicare. Letter sent through [place] and in the mail.

## 2019-10-15 NOTE — Telephone Encounter (Signed)
-----   Message from Venita Lick, NP sent at 10/12/2019 10:11 AM EST ----- 6 months for annual physical + needs outpatient lab visit

## 2019-10-18 NOTE — Telephone Encounter (Signed)
LMOM for pt to call back.

## 2019-10-24 ENCOUNTER — Telehealth: Payer: Self-pay

## 2019-11-07 ENCOUNTER — Telehealth: Payer: Self-pay

## 2019-11-07 ENCOUNTER — Ambulatory Visit: Payer: Self-pay | Admitting: General Practice

## 2019-11-07 NOTE — Chronic Care Management (AMB) (Signed)
  Chronic Care Management   Outreach Note  11/07/2019 Name: Denise Morris MRN: [id number] DOB: Dec 16, 1945  Referred by: Venita Lick, NP Reason for referral : Chronic Care Management (RNCM Chronic Disease Management and Care coordination needs )   An unsuccessful telephone outreach was attempted today. The patient was referred to the case management team for assistance with care management and care coordination.   Follow Up Plan: A HIPPA compliant phone message was left for the patient providing contact information and requesting a return call.   Noreene Larsson RN, MSN, [facility] Mobile: [phone]

## 2019-11-12 DIAGNOSIS — E785 Hyperlipidemia, unspecified: Secondary | ICD-10-CM | POA: Diagnosis not present

## 2019-11-12 DIAGNOSIS — I10 Essential (primary) hypertension: Secondary | ICD-10-CM | POA: Diagnosis not present

## 2019-11-12 DIAGNOSIS — R002 Palpitations: Secondary | ICD-10-CM | POA: Diagnosis not present

## 2019-12-12 ENCOUNTER — Telehealth: Payer: Self-pay

## 2019-12-12 ENCOUNTER — Ambulatory Visit: Payer: Self-pay | Admitting: General Practice

## 2019-12-12 NOTE — Chronic Care Management (AMB) (Signed)
°  Chronic Care Management   Outreach Note  12/12/2019 Name: Denise Morris MRN: [id number] DOB: 11-14-1945  Referred by: Venita Lick, NP Reason for referral : Chronic Care Management (2nd attempt: Follow up: Exercise/HTN/HLD/and other Chronic Health conditions. )   A second unsuccessful telephone outreach was attempted today. The patient was referred to the case management team for assistance with care management and care coordination.   Follow Up Plan: A HIPPA compliant phone message was left for the patient providing contact information and requesting a return call.   Noreene Larsson RN, MSN, [facility] Mobile: [phone]

## 2020-01-16 ENCOUNTER — Telehealth: Payer: Self-pay

## 2020-01-16 ENCOUNTER — Ambulatory Visit: Payer: Self-pay | Admitting: General Practice

## 2020-01-16 NOTE — Chronic Care Management (AMB) (Signed)
°  Chronic Care Management   Outreach Note  01/16/2020 Name: Denise Morris MRN: [id number] DOB: 1946/04/20  Referred by: Venita Lick, NP Reason for referral : Chronic Care Management (Follow up: 3rd attempt: RNCM Chronic Disease Management and Care Coordination needs)   Third unsuccessful telephone outreach was attempted today. The patient was referred to the case management team for assistance with care management and care coordination. The patient's primary care provider has been notified of our unsuccessful attempts to make or maintain contact with the patient. The care management team is pleased to engage with this patient at any time in the future should he/she be interested in assistance from the care management team.   Follow Up Plan: The care management team is available to follow up with the patient after provider conversation with the patient regarding recommendation for care management engagement and subsequent re-referral to the care management team.   Noreene Larsson RN, MSN, [facility] Mobile: [phone]

## 2020-01-21 ENCOUNTER — Other Ambulatory Visit: Payer: Self-pay | Admitting: Nurse Practitioner

## 2020-03-13 DIAGNOSIS — I10 Essential (primary) hypertension: Secondary | ICD-10-CM | POA: Diagnosis not present

## 2020-03-13 DIAGNOSIS — E785 Hyperlipidemia, unspecified: Secondary | ICD-10-CM | POA: Diagnosis not present

## 2020-03-13 DIAGNOSIS — R609 Edema, unspecified: Secondary | ICD-10-CM | POA: Diagnosis not present

## 2020-03-13 DIAGNOSIS — R002 Palpitations: Secondary | ICD-10-CM | POA: Diagnosis not present

## 2020-04-04 ENCOUNTER — Encounter: Payer: Self-pay | Admitting: Nurse Practitioner

## 2020-04-08 ENCOUNTER — Ambulatory Visit (INDEPENDENT_AMBULATORY_CARE_PROVIDER_SITE_OTHER): Payer: Medicare Other | Admitting: Nurse Practitioner

## 2020-04-08 ENCOUNTER — Other Ambulatory Visit: Payer: Self-pay

## 2020-04-08 ENCOUNTER — Ambulatory Visit: Payer: Federal, State, Local not specified - PPO | Admitting: Nurse Practitioner

## 2020-04-08 ENCOUNTER — Encounter: Payer: Self-pay | Admitting: Nurse Practitioner

## 2020-04-08 VITALS — BP 110/66 | HR 72 | Temp 98.5°F | Ht 65.0 in | Wt 206.6 lb

## 2020-04-08 DIAGNOSIS — Z6834 Body mass index (BMI) 34.0-34.9, adult: Secondary | ICD-10-CM | POA: Diagnosis not present

## 2020-04-08 DIAGNOSIS — E6609 Other obesity due to excess calories: Secondary | ICD-10-CM | POA: Diagnosis not present

## 2020-04-08 DIAGNOSIS — I10 Essential (primary) hypertension: Secondary | ICD-10-CM

## 2020-04-08 DIAGNOSIS — M85851 Other specified disorders of bone density and structure, right thigh: Secondary | ICD-10-CM

## 2020-04-08 DIAGNOSIS — R7301 Impaired fasting glucose: Secondary | ICD-10-CM | POA: Diagnosis not present

## 2020-04-08 DIAGNOSIS — E669 Obesity, unspecified: Secondary | ICD-10-CM | POA: Insufficient documentation

## 2020-04-08 DIAGNOSIS — M85852 Other specified disorders of bone density and structure, left thigh: Secondary | ICD-10-CM | POA: Diagnosis not present

## 2020-04-08 DIAGNOSIS — Z1231 Encounter for screening mammogram for malignant neoplasm of breast: Secondary | ICD-10-CM | POA: Diagnosis not present

## 2020-04-08 DIAGNOSIS — E782 Mixed hyperlipidemia: Secondary | ICD-10-CM | POA: Diagnosis not present

## 2020-04-08 DIAGNOSIS — Z0000 Encounter for general adult medical examination without abnormal findings: Secondary | ICD-10-CM | POA: Diagnosis not present

## 2020-04-08 LAB — MICROALBUMIN, URINE WAIVED
Creatinine, Urine Waived: 200 mg/dL (ref 10–300)
Microalb, Ur Waived: 30 mg/L — ABNORMAL HIGH (ref 0–19)
Microalb/Creat Ratio: <30 mg/g (ref <30)

## 2020-04-08 MED ORDER — HYDROCORTISONE 2.5 % EX CREA: TOPICAL_CREAM | CUTANEOUS | 2 refills | Status: DC | PRN

## 2020-04-08 NOTE — Assessment & Plan Note (Signed)
Continue diet focus, recheck A1C today. Initiate medication as needed.

## 2020-04-08 NOTE — Assessment & Plan Note (Signed)
BMI 34.38.  Recommended eating smaller high protein, low fat meals more frequently and exercising 30 mins a day 5 times a week with a goal of 10-15lb weight loss in the next 3 months. Patient voiced their understanding and motivation to adhere to these recommendations.

## 2020-04-08 NOTE — Assessment & Plan Note (Signed)
Chronic, ongoing.  Continue current medication regimen and adjust as needed. Lipid panel today. 

## 2020-04-08 NOTE — Progress Notes (Signed)
BP 110/66    Pulse 72    Temp 98.5 F (36.9 C) (Oral)    Ht 5\' 5"  (1.651 m)    Wt 206 lb 9.6 oz (93.7 kg)    LMP  (LMP Unknown)    SpO2 96%    BMI 34.38 kg/m    Subjective:    Patient ID: Denise Morris, female    DOB: November 02, 1945, 74 y.o.   MRN: [id number]  HPI: Denise Morris is a 74 y.o. female presenting on 04/08/2020 for comprehensive medical examination. Current medical complaints include:none  She currently lives with: self Menopausal Symptoms: no   HYPERTENSION / HYPERLIPIDEMIA Continues on Olmesartan 40 MG, Metoprolol XL 50 MG, Hygroton 12.5 MG, and ASA.  Crestor 20 MG for HLD.  Followed by Dr. Humphrey Rolls with cardiology, last saw July 2021 per her report.  Last echo May 2020 == EF 60%. Satisfied with current treatment? yes Duration of hypertension: chronic BP monitoring frequency: daily BP range: averages 120-130/70-80 BP medication side effects: no Duration of hyperlipidemia: chronic Cholesterol medication side effects: no Cholesterol supplements: none Medication compliance: good compliance Aspirin: yes Recent stressors: no Recurrent headaches: no Visual changes: no Palpitations: no Dyspnea: no Chest pain: no Lower extremity edema: no Dizzy/lightheaded: no   PREDIABETES A1C in August was 5.8%.  Is focused on diet. Polydipsia/polyuria: no Visual disturbance: no Chest pain: no Paresthesias: no  Depression Screen done today and results listed below:  Depression screen [facility] 2/9 04/08/2020 03/20/2019 01/01/2019 11/09/2017 10/19/2016  Decreased Interest 0 0 0 0 0  Down, Depressed, Hopeless 0 0 0 0 0  PHQ - 2 Score 0 0 0 0 0  Altered sleeping - 1 - - 1  Tired, decreased energy - 0 - - 0  Change in appetite - 1 - - 0  Feeling bad or failure about yourself  - 0 - - 0  Trouble concentrating - 0 - - 0  Moving slowly or fidgety/restless - 0 - - 0  Suicidal thoughts - 0 - - 0  PHQ-9 Score - 2 - - 1  Difficult doing work/chores - Not difficult at all - - -    The patient  does not have a history of falls. I did not complete a risk assessment for falls. A plan of care for falls was not documented.   Past Medical History:  Past Medical History:  Diagnosis Date   Anxiety    Depression    Edema    Heart palpitations    Hyperlipidemia    Hypertension    Menopausal state    Osteopenia     Surgical History:  Past Surgical History:  Procedure Laterality Date   ABDOMINAL HYSTERECTOMY  1978   Partial ( Precancerous cells)    Medications:  Current Outpatient Medications on File Prior to Visit  Medication Sig   aspirin 81 MG tablet Take 81 mg by mouth daily.   Calcium Carbonate-Vitamin D (CALCIUM HIGH POTENCY/VITAMIN D) 600-200 MG-UNIT TABS Take by mouth.   chlorthalidone (HYGROTON) 25 MG tablet Take 25 mg by mouth daily.   metoprolol succinate (TOPROL-XL) 50 MG 24 hr tablet TAKE 1 TABLET (50 MG TOTAL) BY MOUTH DAILY. TAKE WITH OR IMMEDIATELY FOLLOWING A MEAL.   olmesartan (BENICAR) 40 MG tablet Take 40 mg by mouth daily.   rosuvastatin (CRESTOR) 20 MG tablet Take 20 mg by mouth daily.   No current facility-administered medications on file prior to visit.    Allergies:  Allergies  Allergen Reactions  Citalopram Hydrobromide Nausea And Vomiting   Penicillins    Zestril [Lisinopril] Cough    Social History:  Social History   Socioeconomic History   Marital status: Widowed    Spouse name: Not on file   Number of children: Not on file   Years of education: Not on file   Highest education level: Master's degree (e.g., MA, MS, MEng, MEd, MSW, MBA)  Occupational History   Occupation: retired  Tobacco Use   Smoking status: Never Smoker   Smokeless tobacco: Never Used  Scientific laboratory technician Use: Never used  Substance and Sexual Activity   Alcohol use: No   Drug use: No   Sexual activity: Yes  Other Topics Concern   Not on file  Social History Narrative   [employer]- meet monthly   [employer]- meet monthly   Social  Determinants of Health   Financial Resource Strain:    Difficulty of Paying Living Expenses: Not on file  Food Insecurity:    Worried About Charity fundraiser in the Last Year: Not on file   [place] in the Last Year: Not on file  Transportation Needs:    Lack of Transportation (Medical): Not on file   Lack of Transportation (Non-Medical): Not on file  Physical Activity:    Days of Exercise per Week: Not on file   Minutes of Exercise per Session: Not on file  Stress:    Feeling of Stress : Not on file  Social Connections:    Frequency of Communication with Friends and Family: Not on file   Frequency of Social Gatherings with Friends and Family: Not on file   Attends Religious Services: Not on file   Active Member of Clubs or Organizations: Not on file   Attends Archivist Meetings: Not on file   Marital Status: Not on file  Intimate Partner Violence:    Fear of Current or Ex-Partner: Not on file   Emotionally Abused: Not on file   Physically Abused: Not on file   Sexually Abused: Not on file   Social History   Tobacco Use  Smoking Status Never Smoker  Smokeless Tobacco Never Used   Social History   Substance and Sexual Activity  Alcohol Use No    Family History:  Family History  Problem Relation Age of Onset   Alzheimer's disease Mother    Hypertension Father    Hypertension Sister    Hyperlipidemia Sister    Breast cancer Neg Hx     Past medical history, surgical history, medications, allergies, family history and social history reviewed with patient today and changes made to appropriate areas of the chart.   Review of Systems - negative All other ROS negative except what is listed above and in the HPI.      Objective:    BP 110/66    Pulse 72    Temp 98.5 F (36.9 C) (Oral)    Ht 5\' 5"  (1.651 m)    Wt 206 lb 9.6 oz (93.7 kg)    LMP  (LMP Unknown)    SpO2 96%    BMI 34.38 kg/m   Wt Readings from Last 3 Encounters:   04/08/20 206 lb 9.6 oz (93.7 kg)  10/12/19 205 lb (93 kg)  03/20/19 198 lb (89.8 kg)    Physical Exam Vitals and nursing note reviewed.  Constitutional:      General: She is awake. She is not in acute distress.    Appearance:  She is well-developed. She is not ill-appearing.  HENT:     Head: Normocephalic and atraumatic.     Right Ear: Hearing, tympanic membrane, ear canal and external ear normal. No drainage.     Left Ear: Hearing, tympanic membrane, ear canal and external ear normal. No drainage.     Nose: Nose normal.     Right Sinus: No maxillary sinus tenderness or frontal sinus tenderness.     Left Sinus: No maxillary sinus tenderness or frontal sinus tenderness.     Mouth/Throat:     Mouth: Mucous membranes are moist.     Pharynx: Oropharynx is clear. Uvula midline. No pharyngeal swelling, oropharyngeal exudate or posterior oropharyngeal erythema.  Eyes:     General: Lids are normal.        Right eye: No discharge.        Left eye: No discharge.     Extraocular Movements: Extraocular movements intact.     Conjunctiva/sclera: Conjunctivae normal.     Pupils: Pupils are equal, round, and reactive to light.     Visual Fields: Right eye visual fields normal and left eye visual fields normal.  Neck:     Thyroid: No thyromegaly.     Vascular: No carotid bruit.     Trachea: Trachea normal.  Cardiovascular:     Rate and Rhythm: Normal rate and regular rhythm.     Heart sounds: Normal heart sounds. No murmur heard.  No gallop.   Pulmonary:     Effort: Pulmonary effort is normal. No accessory muscle usage or respiratory distress.     Breath sounds: Normal breath sounds.  Chest:     Breasts:        Right: Normal.        Left: Normal.  Abdominal:     General: Bowel sounds are normal.     Palpations: Abdomen is soft. There is no hepatomegaly or splenomegaly.     Tenderness: There is no abdominal tenderness.  Musculoskeletal:        General: Normal range of motion.      Cervical back: Normal range of motion and neck supple.     Right lower leg: No edema.     Left lower leg: No edema.  Lymphadenopathy:     Head:     Right side of head: No submental, submandibular, tonsillar, preauricular or posterior auricular adenopathy.     Left side of head: No submental, submandibular, tonsillar, preauricular or posterior auricular adenopathy.     Cervical: No cervical adenopathy.     Upper Body:     Right upper body: No supraclavicular, axillary or pectoral adenopathy.     Left upper body: No supraclavicular, axillary or pectoral adenopathy.  Skin:    General: Skin is warm and dry.     Capillary Refill: Capillary refill takes less than 2 seconds.     Findings: No rash.  Neurological:     Mental Status: She is alert and oriented to person, place, and time.     Cranial Nerves: Cranial nerves are intact.     Gait: Gait is intact.     Deep Tendon Reflexes: Reflexes are normal and symmetric.     Reflex Scores:      Brachioradialis reflexes are 2+ on the right side and 2+ on the left side.      Patellar reflexes are 2+ on the right side and 2+ on the left side. Psychiatric:        Attention and Perception: Attention  normal.        Mood and Affect: Mood normal.        Speech: Speech normal.        Behavior: Behavior normal. Behavior is cooperative.        Thought Content: Thought content normal.        Judgment: Judgment normal.     Results for orders placed or performed in visit on 07/26/19  Novel Coronavirus, NAA ([facility])   Specimen: Nasopharyngeal(NP) swabs in vial transport medium   NASOPHARYNGE  TESTING  Result Value Ref Range   SARS-CoV-2, NAA Not Detected Not Detected  Specimen status report  Result Value Ref Range   specimen status report Comment       Assessment & Plan:   Problem List Items Addressed This Visit      Cardiovascular and Mediastinum   Hypertension    Chronic, stable with BP below goal in office and on home readings.  Continue  current medication regimen and collaboration with cardiology.  Continue to monitor BP at home regularly and focus on DASH diet.  CMP, TSH, CBC, and urine ALB today.  Return in 6 months.      Relevant Orders   Microalbumin, Urine Waived   CBC with Differential/Platelet   Comprehensive metabolic panel   TSH     Endocrine   IFG (impaired fasting glucose)    Continue diet focus, recheck A1C today. Initiate medication as needed.      Relevant Orders   HgB A1c     Musculoskeletal and Integument   Osteopenia    Recent scan in 2017 was normal, will plan recheck in February 2022.        Other   Hyperlipidemia    Chronic, ongoing.  Continue current medication regimen and adjust as needed.  Lipid panel today.      Relevant Orders   Comprehensive metabolic panel   Lipid Panel w/o Chol/HDL Ratio   Obesity    BMI 34.38.  Recommended eating smaller high protein, low fat meals more frequently and exercising 30 mins a day 5 times a week with a goal of 10-15lb weight loss in the next 3 months. Patient voiced their understanding and motivation to adhere to these recommendations.        Other Visit Diagnoses    Routine general medical examination at a health care facility    -  Primary   Encounter for screening mammogram for malignant neoplasm of breast       Mammogram ordered   Relevant Orders   MM DIGITAL SCREENING BILATERAL       Follow up plan: Return in about 6 months (around 10/09/2020) for HTN/HLD and IFG.   LABORATORY TESTING:  - Pap smear: not applicable  IMMUNIZATIONS:   - Tdap: Tetanus vaccination status reviewed: going to [place]. - Influenza: Up to date - Pneumovax: Up to date - Prevnar: Up to date - HPV: Not applicable - Zostavax vaccine: Up to date  SCREENING: -Mammogram: Up to date  - Colonoscopy: wishes to wait until Covid improves  - Bone Density: Up to date  -Hearing Test: Not applicable  -Spirometry: Not applicable   PATIENT COUNSELING:   Advised to  take 1 mg of folate supplement per day if capable of pregnancy.   Sexuality: Discussed sexually transmitted diseases, partner selection, use of condoms, avoidance of unintended pregnancy  and contraceptive alternatives.   Advised to avoid cigarette smoking.  I discussed with the patient that most people either abstain from alcohol  or drink within safe limits (<=14/week and <=4 drinks/occasion for males, <=7/weeks and <= 3 drinks/occasion for females) and that the risk for alcohol disorders and other health effects rises proportionally with the number of drinks per week and how often a drinker exceeds daily limits.  Discussed cessation/primary prevention of drug use and availability of treatment for abuse.   Diet: Encouraged to adjust caloric intake to maintain  or achieve ideal body weight, to reduce intake of dietary saturated fat and total fat, to limit sodium intake by avoiding high sodium foods and not adding table salt, and to maintain adequate dietary potassium and calcium preferably from fresh fruits, vegetables, and low-fat dairy products.    stressed the importance of regular exercise  Injury prevention: Discussed safety belts, safety helmets, smoke detector, smoking near bedding or upholstery.   Dental health: Discussed importance of regular tooth brushing, flossing, and dental visits.    NEXT PREVENTATIVE PHYSICAL DUE IN 1 YEAR. Return in about 6 months (around 10/09/2020) for HTN/HLD and IFG.

## 2020-04-08 NOTE — Patient Instructions (Signed)
[facility]  °Address: [address], [place], [place] [address]  °Phone: [phone] ° ° °DASH Eating Plan °DASH stands for "Dietary Approaches to Stop Hypertension." The DASH eating plan is a healthy eating plan that has been shown to reduce high blood pressure (hypertension). It may also reduce your risk for type 2 diabetes, heart disease, and stroke. The DASH eating plan may also help with weight loss. °What are tips for following this plan? ° °General guidelines °· Avoid eating more than 2,300 mg (milligrams) of salt (sodium) a day. If you have hypertension, you may need to reduce your sodium intake to 1,500 mg a day. °· Limit alcohol intake to no more than 1 drink a day for nonpregnant women and 2 drinks a day for men. One drink equals 12 oz of beer, 5 oz of wine, or 1½ oz of hard liquor. °· Work with your health care provider to maintain a healthy body weight or to lose weight. Ask what an ideal weight is for you. °· Get at least 30 minutes of exercise that causes your heart to beat faster (aerobic exercise) most days of the week. Activities may include walking, swimming, or biking. °· Work with your health care provider or diet and nutrition specialist (dietitian) to adjust your eating plan to your individual calorie needs. °Reading food labels ° °· Check food labels for the amount of sodium per serving. Choose foods with less than 5 percent of the Daily Value of sodium. Generally, foods with less than 300 mg of sodium per serving fit into this eating plan. °· To find whole grains, look for the word "whole" as the first word in the ingredient list. °Shopping °· Buy products labeled as "low-sodium" or "no salt added." °· Buy fresh foods. Avoid canned foods and premade or frozen meals. °Cooking °· Avoid adding salt when cooking. Use salt-free seasonings or herbs instead of table salt or sea salt. Check with your health care provider or pharmacist before using salt  substitutes. °· Do not fry foods. Cook foods using healthy methods such as baking, boiling, grilling, and broiling instead. °· Cook with heart-healthy oils, such as olive, canola, soybean, or sunflower oil. °Meal planning °· Eat a balanced diet that includes: °? 5 or more servings of fruits and vegetables each day. At each meal, try to fill half of your plate with fruits and vegetables. °? Up to 6-8 servings of whole grains each day. °? Less than 6 oz of lean meat, poultry, or fish each day. A 3-oz serving of meat is about the same size as a deck of cards. One egg equals 1 oz. °? 2 servings of low-fat dairy each day. °? A serving of nuts, seeds, or beans 5 times each week. °? Heart-healthy fats. Healthy fats called Omega-3 fatty acids are found in foods such as flaxseeds and coldwater fish, like sardines, salmon, and mackerel. °· Limit how much you eat of the following: °? Canned or prepackaged foods. °? Food that is high in trans fat, such as fried foods. °? Food that is high in saturated fat, such as fatty meat. °? Sweets, desserts, sugary drinks, and other foods with added sugar. °? Full-fat dairy products. °· Do not salt foods before eating. °· Try to eat at least 2 vegetarian meals each week. °· Eat more home-cooked food and less restaurant, buffet, and fast food. °· When eating at a restaurant, ask that your food be prepared with less salt or   no salt, if possible. °What foods are recommended? °The items listed may not be a complete list. Talk with your dietitian about what dietary choices are best for you. °Grains °Whole-grain or whole-wheat bread. Whole-grain or whole-wheat pasta. Brown rice. Oatmeal. Quinoa. Bulgur. Whole-grain and low-sodium cereals. Pita bread. Low-fat, low-sodium crackers. Whole-wheat flour tortillas. °Vegetables °Fresh or frozen vegetables (raw, steamed, roasted, or grilled). Low-sodium or reduced-sodium tomato and vegetable juice. Low-sodium or reduced-sodium tomato sauce and tomato  paste. Low-sodium or reduced-sodium canned vegetables. °Fruits °All fresh, dried, or frozen fruit. Canned fruit in natural juice (without added sugar). °Meat and other protein foods °Skinless chicken or turkey. Ground chicken or turkey. Pork with fat trimmed off. Fish and seafood. Egg whites. Dried beans, peas, or lentils. Unsalted nuts, nut butters, and seeds. Unsalted canned beans. Lean cuts of beef with fat trimmed off. Low-sodium, lean deli meat. °Dairy °Low-fat (1%) or fat-free (skim) milk. Fat-free, low-fat, or reduced-fat cheeses. Nonfat, low-sodium ricotta or cottage cheese. Low-fat or nonfat yogurt. Low-fat, low-sodium cheese. °Fats and oils °Soft margarine without trans fats. Vegetable oil. Low-fat, reduced-fat, or light mayonnaise and salad dressings (reduced-sodium). Canola, safflower, olive, soybean, and sunflower oils. Avocado. °Seasoning and other foods °Herbs. Spices. Seasoning mixes without salt. Unsalted popcorn and pretzels. Fat-free sweets. °What foods are not recommended? °The items listed may not be a complete list. Talk with your dietitian about what dietary choices are best for you. °Grains °Baked goods made with fat, such as croissants, muffins, or some breads. Dry pasta or rice meal packs. °Vegetables °Creamed or fried vegetables. Vegetables in a cheese sauce. Regular canned vegetables (not low-sodium or reduced-sodium). Regular canned tomato sauce and paste (not low-sodium or reduced-sodium). Regular tomato and vegetable juice (not low-sodium or reduced-sodium). Pickles. Olives. °Fruits °Canned fruit in a light or heavy syrup. Fried fruit. Fruit in cream or butter sauce. °Meat and other protein foods °Fatty cuts of meat. Ribs. Fried meat. Bacon. Sausage. Bologna and other processed lunch meats. Salami. Fatback. Hotdogs. Bratwurst. Salted nuts and seeds. Canned beans with added salt. Canned or smoked fish. Whole eggs or egg yolks. Chicken or turkey with skin. °Dairy °Whole or 2% milk,  cream, and half-and-half. Whole or full-fat cream cheese. Whole-fat or sweetened yogurt. Full-fat cheese. Nondairy creamers. Whipped toppings. Processed cheese and cheese spreads. °Fats and oils °Butter. Stick margarine. Lard. Shortening. Ghee. Bacon fat. Tropical oils, such as coconut, palm kernel, or palm oil. °Seasoning and other foods °Salted popcorn and pretzels. Onion salt, garlic salt, seasoned salt, table salt, and sea salt. Worcestershire sauce. Tartar sauce. Barbecue sauce. Teriyaki sauce. Soy sauce, including reduced-sodium. Steak sauce. Canned and packaged gravies. Fish sauce. Oyster sauce. Cocktail sauce. Horseradish that you find on the shelf. Ketchup. Mustard. Meat flavorings and tenderizers. Bouillon cubes. Hot sauce and Tabasco sauce. Premade or packaged marinades. Premade or packaged taco seasonings. Relishes. Regular salad dressings. °Where to find more information: °· National Heart, Lung, and Blood Institute: www.nhlbi.nih.gov °· American Heart Association: www.heart.org °Summary °· The DASH eating plan is a healthy eating plan that has been shown to reduce high blood pressure (hypertension). It may also reduce your risk for type 2 diabetes, heart disease, and stroke. °· With the DASH eating plan, you should limit salt (sodium) intake to 2,300 mg a day. If you have hypertension, you may need to reduce your sodium intake to 1,500 mg a day. °· When on the DASH eating plan, aim to eat more fresh fruits and vegetables, whole grains, lean proteins, low-fat dairy, and   heart-healthy fats. °· Work with your health care provider or diet and nutrition specialist (dietitian) to adjust your eating plan to your individual calorie needs. °This information is not intended to replace advice given to you by your health care provider. Make sure you discuss any questions you have with your health care provider. °Document Revised: 07/15/2017 Document Reviewed: 07/26/2016 °Elsevier Patient Education © 2020 Elsevier  Inc. ° °

## 2020-04-08 NOTE — Assessment & Plan Note (Addendum)
Chronic, stable with BP below goal in office and on home readings.  Continue current medication regimen and collaboration with cardiology.  Continue to monitor BP at home regularly and focus on DASH diet.  CMP, TSH, CBC, and urine ALB today.  Return in 6 months.

## 2020-04-08 NOTE — Assessment & Plan Note (Signed)
Recent scan in 2017 was normal, will plan recheck in February 2022.

## 2020-04-09 LAB — CBC WITH DIFFERENTIAL/PLATELET
Basophils Absolute: 0.1 10*3/uL (ref 0.0–0.2)
Basos: 1 %
EOS (ABSOLUTE): 0.3 10*3/uL (ref 0.0–0.4)
Eos: 6 %
Hematocrit: 40.8 % (ref 34.0–46.6)
Hemoglobin: 13.5 g/dL (ref 11.1–15.9)
Immature Grans (Abs): 0 10*3/uL (ref 0.0–0.1)
Immature Granulocytes: 0 %
Lymphocytes Absolute: 2.3 10*3/uL (ref 0.7–3.1)
Lymphs: 42 %
MCH: 30.4 pg (ref 26.6–33.0)
MCHC: 33.1 g/dL (ref 31.5–35.7)
MCV: 92 fL (ref 79–97)
Monocytes Absolute: 0.4 10*3/uL (ref 0.1–0.9)
Monocytes: 8 %
Neutrophils Absolute: 2.3 10*3/uL (ref 1.4–7.0)
Neutrophils: 43 %
Platelets: 149 10*3/uL — ABNORMAL LOW (ref 150–450)
RBC: 4.44 x10E6/uL (ref 3.77–5.28)
RDW: 13.1 % (ref 11.7–15.4)
WBC: 5.4 10*3/uL (ref 3.4–10.8)

## 2020-04-09 LAB — COMPREHENSIVE METABOLIC PANEL
ALT: 16 IU/L (ref 0–32)
AST: 20 IU/L (ref 0–40)
Albumin/Globulin Ratio: 1.5 (ref 1.2–2.2)
Albumin: 3.8 g/dL (ref 3.7–4.7)
Alkaline Phosphatase: 69 IU/L (ref 48–121)
BUN/Creatinine Ratio: 21 (ref 12–28)
BUN: 21 mg/dL (ref 8–27)
Bilirubin Total: 0.5 mg/dL (ref 0.0–1.2)
CO2: 26 mmol/L (ref 20–29)
Calcium: 9 mg/dL (ref 8.7–10.3)
Chloride: 103 mmol/L (ref 96–106)
Creatinine, Ser: 1.01 mg/dL — ABNORMAL HIGH (ref 0.57–1.00)
GFR calc Af Amer: 62 mL/min/{1.73_m2} (ref >59)
GFR calc non Af Amer: 54 mL/min/{1.73_m2} — ABNORMAL LOW (ref >59)
Globulin, Total: 2.6 g/dL (ref 1.5–4.5)
Glucose: 103 mg/dL — ABNORMAL HIGH (ref 65–99)
Potassium: 3.6 mmol/L (ref 3.5–5.2)
Sodium: 141 mmol/L (ref 134–144)
Total Protein: 6.4 g/dL (ref 6.0–8.5)

## 2020-04-09 LAB — LIPID PANEL W/O CHOL/HDL RATIO
Cholesterol, Total: 120 mg/dL (ref 100–199)
HDL: 49 mg/dL (ref >39)
LDL Chol Calc (NIH): 56 mg/dL (ref 0–99)
Triglycerides: 75 mg/dL (ref 0–149)
VLDL Cholesterol Cal: 15 mg/dL (ref 5–40)

## 2020-04-09 LAB — TSH: TSH: 0.883 u[IU]/mL (ref 0.450–4.500)

## 2020-04-09 LAB — HEMOGLOBIN A1C
Est. average glucose Bld gHb Est-mCnc: 143 mg/dL
Hgb A1c MFr Bld: 6.6 % — ABNORMAL HIGH (ref 4.8–5.6)

## 2020-04-09 NOTE — Progress Notes (Signed)
Contacted via MyChart  Good evening Ms. Banning.  I attempted to call but your mailbox was full and unable to leave message.  Please let me know you receive this mychart message.  Your labs have returned.   - Platelets on this check continue to be on mildly low side, with low normal being 150 and your level 149.  Improved from previous 146.  We will continue to monitor this at visits. - Kidney function is stable - Thyroid and cholesterol labs look great - A1C has creeped into diabetic range, now at 6.6%.  Previous was 5.8%.  Anything 6.5% or greater is considered diabetes.  We have two options.  You can focus heavily on diet changes and regular activity over next 3 months and then we can have you return and recheck -- we could also place referral to diabetes education where you can learn about diet.  Or we can start a little Metformin to help reduce this level.  I am leaning more towards diet and exercise vs adding medication, but would like to hear your thoughts.  Let me know.  Thank you. Keep being awesome!!  Thank you for allowing me to participate in your care. Kindest regards, Jmari Pelc

## 2020-04-22 ENCOUNTER — Telehealth: Payer: Self-pay | Admitting: Nurse Practitioner

## 2020-04-22 NOTE — Telephone Encounter (Signed)
Reason for CRM:   Left message for patient to call back and schedule the Medicare Annual Wellness Visit (AWV) virtual by phone Last AWV  01/01/2019  Please schedule at anytime with [place].  40 minute appointment  Any questions, please contact me at [phone]

## 2020-04-22 NOTE — Telephone Encounter (Signed)
Copied from [place] [id number]. Topic: Medicare AWV >> Apr 22, 2020  3:02 PM Cher Nakai R wrote: Reason for CRM:  Left message for patient to call back and schedule the Medicare Annual Wellness Visit (AWV) in office or virtual  Last AWV  01/01/2019  Please schedule at anytime with [place].  40 minute appointment  Any questions, please contact me at [phone]

## 2020-05-22 DIAGNOSIS — Z23 Encounter for immunization: Secondary | ICD-10-CM | POA: Diagnosis not present

## 2020-05-30 ENCOUNTER — Other Ambulatory Visit: Payer: Self-pay | Admitting: Nurse Practitioner

## 2020-06-01 IMAGING — MG DIGITAL SCREENING BILATERAL MAMMOGRAM WITH TOMO AND CAD
8 series · 8 of 24 positions shown · non-contrast
Comparison: Previous exam(s).

CLINICAL DATA: Screening.

EXAM:
DIGITAL SCREENING BILATERAL MAMMOGRAM WITH TOMO AND CAD

[R MLO synth-2D]
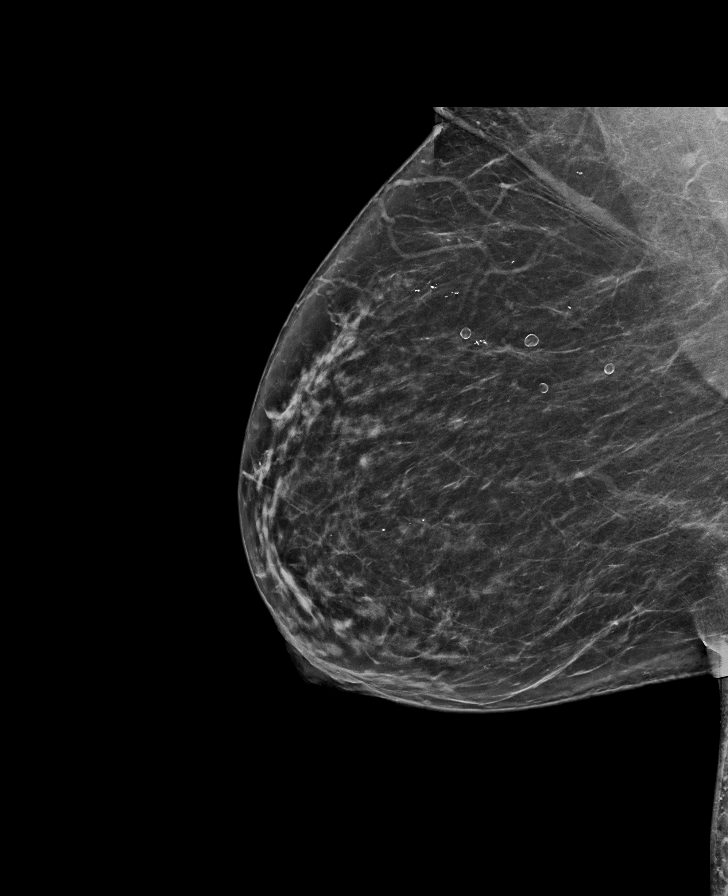

[L MLO synth-2D]
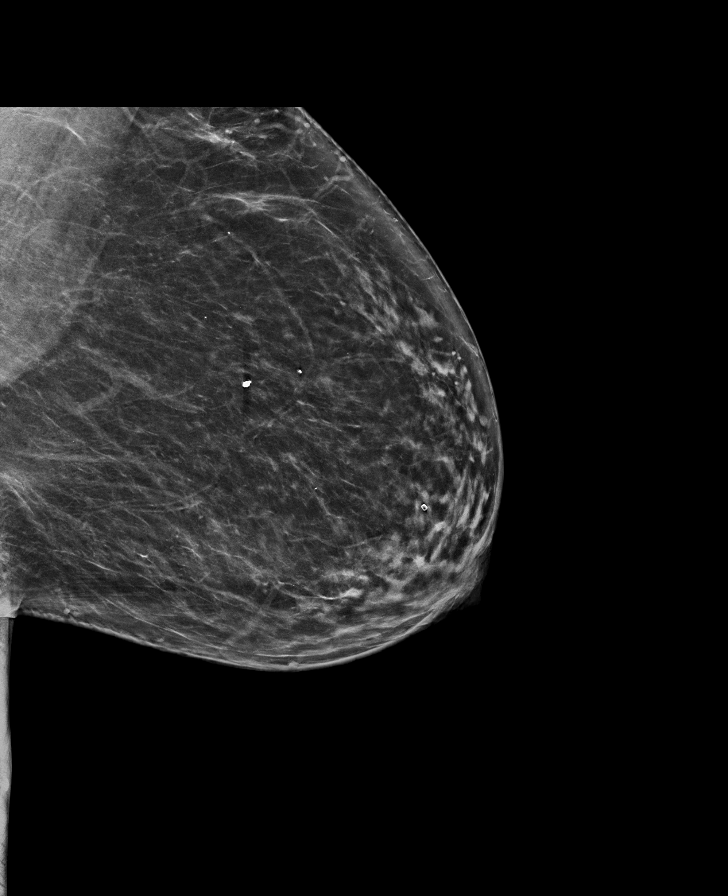

[R CC synth-2D]
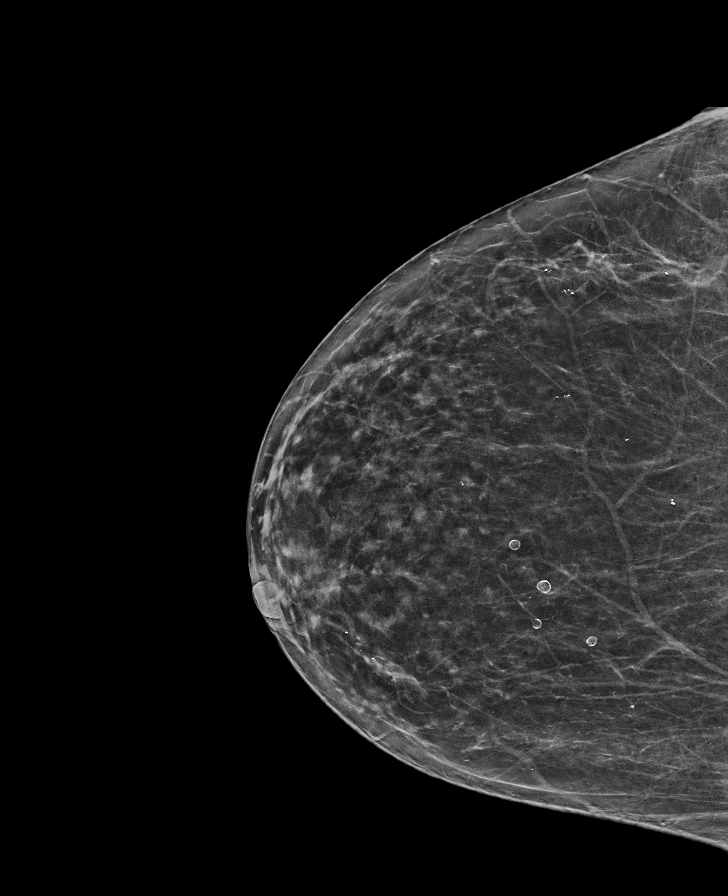

[L CC synth-2D]
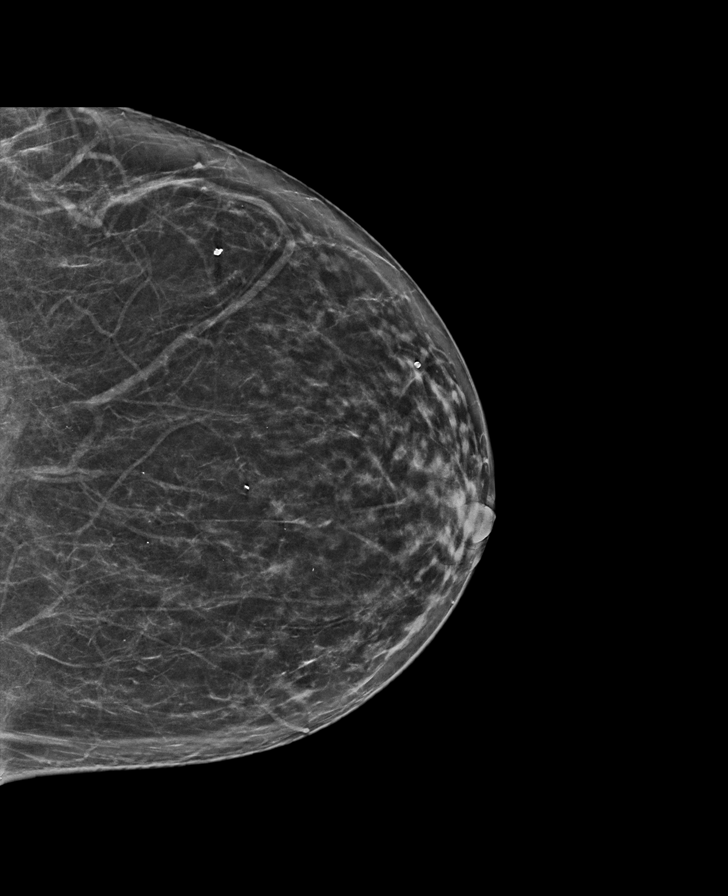

[R CC tomo · tomo slice 35/69.0]
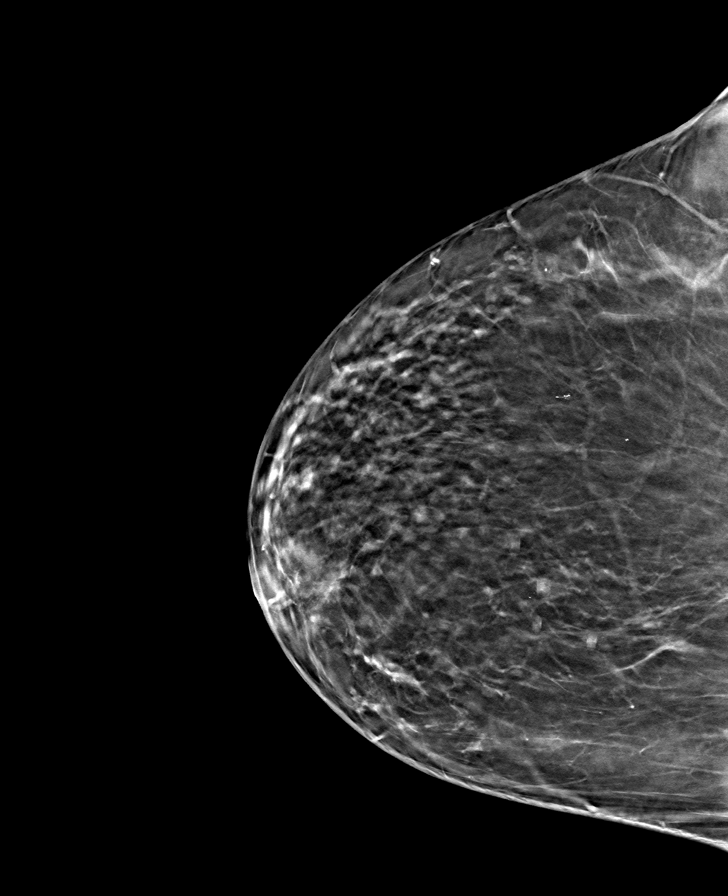

[L MLO tomo · tomo slice 41/80.0]
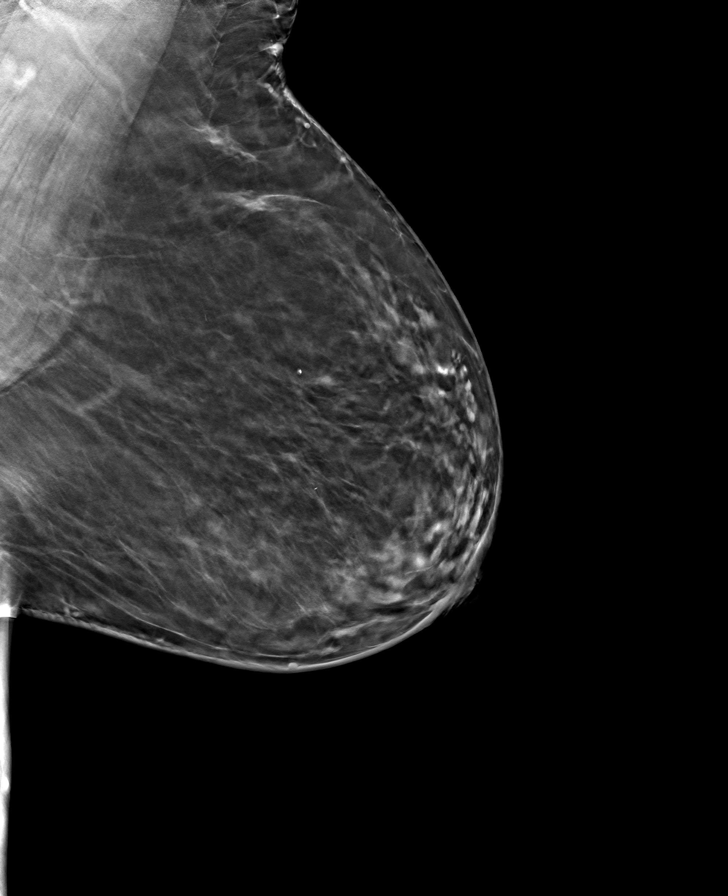

[L CC tomo · tomo slice 35/68.0]
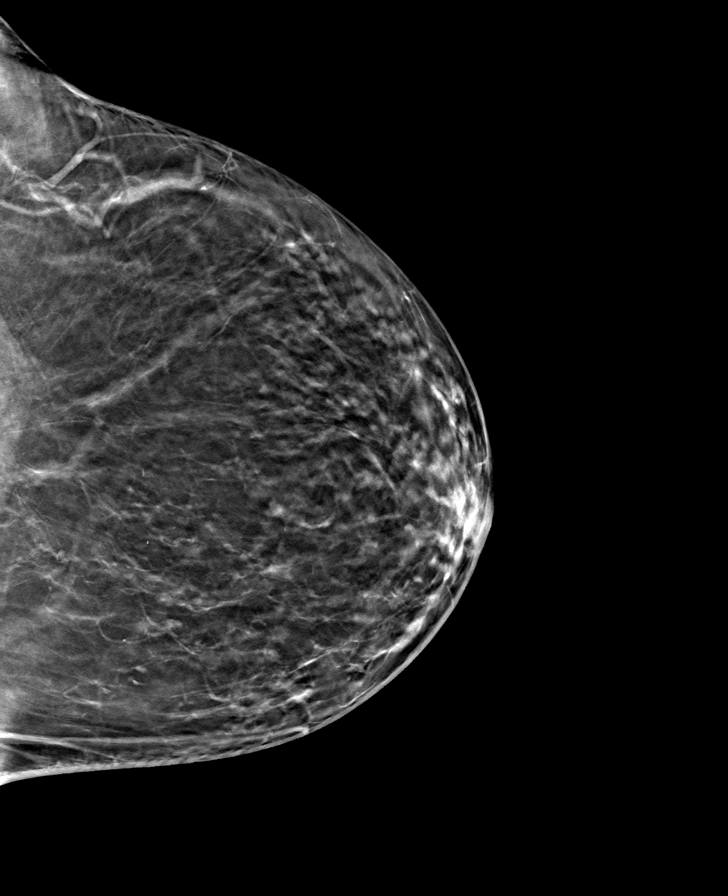

[R MLO tomo · tomo slice 40/79.0]
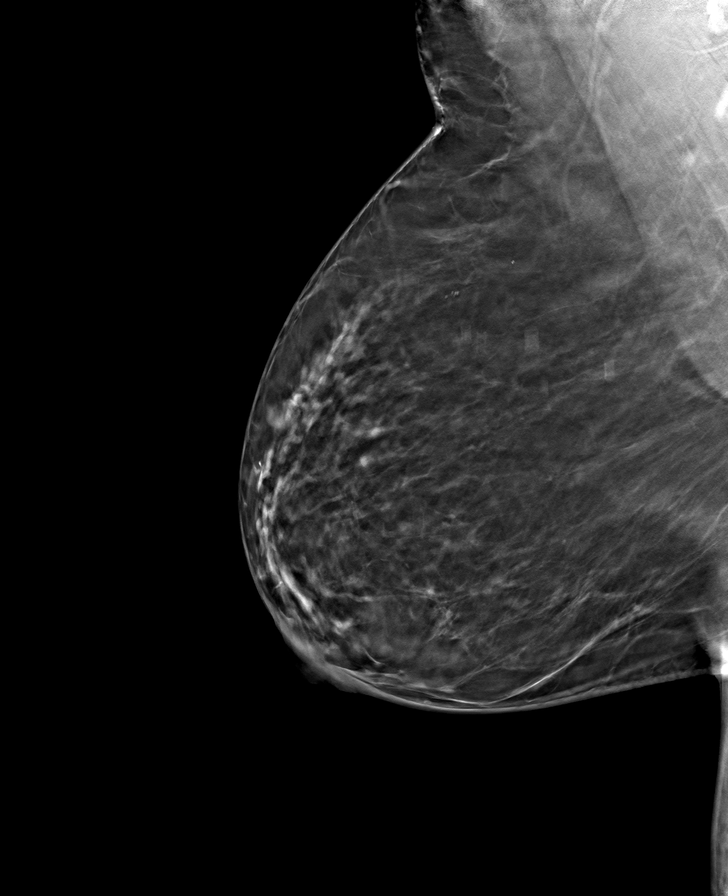

[8 of 24 positions shown; findings below may reference images not displayed]

ACR Breast Density Category b: There are scattered areas of
fibroglandular density.
FINDINGS: There are no findings suspicious for malignancy. Images were
processed with CAD.
IMPRESSION: No mammographic evidence of malignancy. A result letter of this
screening mammogram will be mailed directly to the patient.

RECOMMENDATION:
Screening mammogram in one year. (Code:[id number])

BI-RADS CATEGORY  1: Negative.

## 2020-06-19 ENCOUNTER — Encounter: Payer: Self-pay | Admitting: Nurse Practitioner

## 2020-06-30 ENCOUNTER — Ambulatory Visit
Admission: RE | Admit: 2020-06-30 | Discharge: 2020-06-30 | Disposition: A | Discharge status: Home / Self Care | Payer: Medicare Other | Source: Ambulatory Visit | Attending: Nurse Practitioner | Admitting: Nurse Practitioner

## 2020-06-30 ENCOUNTER — Other Ambulatory Visit: Payer: Self-pay

## 2020-06-30 DIAGNOSIS — Z1231 Encounter for screening mammogram for malignant neoplasm of breast: Secondary | ICD-10-CM | POA: Insufficient documentation

## 2020-07-02 ENCOUNTER — Other Ambulatory Visit: Payer: Self-pay | Admitting: Nurse Practitioner

## 2020-07-02 DIAGNOSIS — N6489 Other specified disorders of breast: Secondary | ICD-10-CM

## 2020-07-02 DIAGNOSIS — R928 Other abnormal and inconclusive findings on diagnostic imaging of breast: Secondary | ICD-10-CM

## 2020-07-04 ENCOUNTER — Ambulatory Visit: Payer: Medicare Other | Admitting: Nurse Practitioner

## 2020-07-04 NOTE — Progress Notes (Signed)
Contacted via MyChart  Good morning Denise Morris.  I was checking to see if you had heard results for mammogram.  They did notice some asymmetry on the right side and would like to schedule an ultrasound and further imaging of area.  Could you please call [facility] to schedule.  Thank you.  If any questions let me know. Keep being awesome!!  Thank you for allowing me to participate in your care. Kindest regards, Kemaya Dorner

## 2020-07-08 ENCOUNTER — Ambulatory Visit: Payer: Federal, State, Local not specified - PPO | Admitting: Nurse Practitioner

## 2020-07-14 ENCOUNTER — Ambulatory Visit
Admission: RE | Admit: 2020-07-14 | Discharge: 2020-07-14 | Disposition: A | Discharge status: Home / Self Care | Payer: Medicare Other | Source: Ambulatory Visit | Attending: Nurse Practitioner | Admitting: Nurse Practitioner

## 2020-07-14 ENCOUNTER — Other Ambulatory Visit: Payer: Self-pay

## 2020-07-14 DIAGNOSIS — N6489 Other specified disorders of breast: Secondary | ICD-10-CM | POA: Diagnosis not present

## 2020-07-14 DIAGNOSIS — R928 Other abnormal and inconclusive findings on diagnostic imaging of breast: Secondary | ICD-10-CM

## 2020-09-11 DIAGNOSIS — I10 Essential (primary) hypertension: Secondary | ICD-10-CM | POA: Diagnosis not present

## 2020-09-11 DIAGNOSIS — R002 Palpitations: Secondary | ICD-10-CM | POA: Diagnosis not present

## 2020-09-11 DIAGNOSIS — R609 Edema, unspecified: Secondary | ICD-10-CM | POA: Diagnosis not present

## 2020-09-11 DIAGNOSIS — E785 Hyperlipidemia, unspecified: Secondary | ICD-10-CM | POA: Diagnosis not present

## 2020-09-11 DIAGNOSIS — I340 Nonrheumatic mitral (valve) insufficiency: Secondary | ICD-10-CM | POA: Diagnosis not present

## 2020-10-11 ENCOUNTER — Encounter: Payer: Self-pay | Admitting: Nurse Practitioner

## 2020-10-14 ENCOUNTER — Other Ambulatory Visit: Payer: Self-pay

## 2020-10-14 ENCOUNTER — Encounter: Payer: Self-pay | Admitting: Nurse Practitioner

## 2020-10-14 ENCOUNTER — Ambulatory Visit (INDEPENDENT_AMBULATORY_CARE_PROVIDER_SITE_OTHER): Payer: Medicare Other | Admitting: Nurse Practitioner

## 2020-10-14 VITALS — BP 110/62 | HR 63 | Temp 98.1°F | Ht 65.87 in | Wt 205.6 lb

## 2020-10-14 DIAGNOSIS — E6609 Other obesity due to excess calories: Secondary | ICD-10-CM

## 2020-10-14 DIAGNOSIS — I10 Essential (primary) hypertension: Secondary | ICD-10-CM

## 2020-10-14 DIAGNOSIS — E782 Mixed hyperlipidemia: Secondary | ICD-10-CM | POA: Diagnosis not present

## 2020-10-14 DIAGNOSIS — R809 Proteinuria, unspecified: Secondary | ICD-10-CM | POA: Insufficient documentation

## 2020-10-14 DIAGNOSIS — E1129 Type 2 diabetes mellitus with other diabetic kidney complication: Secondary | ICD-10-CM | POA: Insufficient documentation

## 2020-10-14 DIAGNOSIS — R7309 Other abnormal glucose: Secondary | ICD-10-CM | POA: Diagnosis not present

## 2020-10-14 DIAGNOSIS — M85852 Other specified disorders of bone density and structure, left thigh: Secondary | ICD-10-CM | POA: Diagnosis not present

## 2020-10-14 DIAGNOSIS — Z6834 Body mass index (BMI) 34.0-34.9, adult: Secondary | ICD-10-CM

## 2020-10-14 DIAGNOSIS — R801 Persistent proteinuria, unspecified: Secondary | ICD-10-CM

## 2020-10-14 LAB — BAYER DCA HB A1C WAIVED: HB A1C (BAYER DCA - WAIVED): 6.1 % (ref <7.0)

## 2020-10-14 LAB — MICROALBUMIN, URINE WAIVED
Creatinine, Urine Waived: 200 mg/dL (ref 10–300)
Microalb, Ur Waived: 30 mg/L — ABNORMAL HIGH (ref 0–19)
Microalb/Creat Ratio: <30 mg/g (ref <30)

## 2020-10-14 MED ORDER — HYDROCORTISONE 2.5 % EX CREA
TOPICAL_CREAM | CUTANEOUS | 2 refills | Status: DC | PRN
Start: 2020-10-14 — End: 2021-05-19

## 2020-10-14 MED ORDER — METOPROLOL SUCCINATE ER 50 MG PO TB24
50.0000 mg | ORAL_TABLET | Freq: Every day | ORAL | 4 refills | Status: DC
Start: 2020-10-14 — End: 2021-11-18

## 2020-10-14 NOTE — Assessment & Plan Note (Signed)
Chronic, stable with BP below goal in office and on home readings.  Continue current medication regimen and collaboration with cardiology.  Continue to monitor BP at home regularly and focus on DASH diet.  CMP and urine ALB today.  Return in 6 months for physical.

## 2020-10-14 NOTE — Assessment & Plan Note (Signed)
Chronic, ongoing.  Continue current medication regimen and adjust as needed. Lipid panel today. 

## 2020-10-14 NOTE — Assessment & Plan Note (Signed)
Recent scan in 2017 was normal, will plan recheck in November 2022 with mammogram. Check Vitamin D level and continue supplement daily.

## 2020-10-14 NOTE — Assessment & Plan Note (Signed)
BMI 33.32.  Recommended eating smaller high protein, low fat meals more frequently and exercising 30 mins a day 5 times a week with a goal of 10-15lb weight loss in the next 3 months. Patient voiced their understanding and motivation to adhere to these recommendations.

## 2020-10-14 NOTE — Patient Instructions (Signed)

## 2020-10-14 NOTE — Assessment & Plan Note (Signed)
With HTN and elevated A1c, prediabetic range.  Urine ALB 30 today.  Continue Olmesartan for kidney protection.

## 2020-10-14 NOTE — Progress Notes (Signed)
BP 110/62 (BP Location: Left Arm)   Pulse 63   Temp 98.1 F (36.7 C) (Oral)   Ht 5' 5.87" (1.673 m)   Wt 205 lb 9.6 oz (93.3 kg)   LMP  (LMP Unknown)   SpO2 98%   BMI 33.32 kg/m    Subjective:    Patient ID: Denise Morris, female    DOB: August 13, 1946, 75 y.o.   MRN: [id number]  HPI: Denise Morris is a 75 y.o. female  Chief Complaint  Patient presents with  . Hypertension  . Hyperlipidemia  . IFG   HYPERTENSION / HYPERLIPIDEMIA Continues on Olmesartan 40 MG, Metoprolol XL 50 MG, Hygroton 12.5 MG, and ASA. Crestor 20 MG for HLD. Followed by Dr. Humphrey Rolls with cardiology, last saw January 2022 per her report with no changes.  Last echo May 2020 == EF 60%. Satisfied with current treatment?yes Duration of hypertension:chronic BP monitoring frequency:daily BP range:averages 120-130/70-80 -- this morning prior to medicine 140/80 and then after medication 120 something over 80. BP medication side effects:no Duration of hyperlipidemia:chronic Cholesterol medication side effects:no Cholesterol supplements: none Medication compliance:good compliance Aspirin:yes Recent stressors:no Recurrent headaches:no Visual changes:no Palpitations:no Dyspnea:no Chest pain:no Lower extremity edema:yes at baseline -- comes and goes Dizzy/lightheaded:no  PREDIABETES A1C in August was 6.6%. Is focused on diet. Polydipsia/polyuria:no Visual disturbance:no Chest pain:no Paresthesias:no  OSTEOPENIA Last DEXA 10/06/2015 -- the BMD measured at Femur Neck Left is 0.995 g/cm2 with a T-score of -0.3.  Satisfied with current treatment?: yes Adequate calcium & vitamin D: yes Intolerance to bisphosphonates:not taken Weight bearing exercises: yes  Relevant past medical, surgical, family and social history reviewed and updated as indicated. Interim medical history since our last visit reviewed. Allergies and medications reviewed and updated.  Review of Systems   Constitutional: Negative for activity change, appetite change, diaphoresis, fatigue and fever.  Respiratory: Negative for cough, chest tightness, shortness of breath and wheezing.   Cardiovascular: Negative for chest pain, palpitations and leg swelling.  Gastrointestinal: Negative.   Endocrine: Negative for polydipsia, polyphagia and polyuria.  Neurological: Negative.   Psychiatric/Behavioral: Negative.     Per HPI unless specifically indicated above     Objective:    BP 110/62 (BP Location: Left Arm)   Pulse 63   Temp 98.1 F (36.7 C) (Oral)   Ht 5' 5.87" (1.673 m)   Wt 205 lb 9.6 oz (93.3 kg)   LMP  (LMP Unknown)   SpO2 98%   BMI 33.32 kg/m   Wt Readings from Last 3 Encounters:  10/14/20 205 lb 9.6 oz (93.3 kg)  04/08/20 206 lb 9.6 oz (93.7 kg)  10/12/19 205 lb (93 kg)    Physical Exam Vitals and nursing note reviewed.  Constitutional:      General: She is awake. She is not in acute distress.    Appearance: She is well-developed and well-groomed. She is obese. She is not ill-appearing.  HENT:     Head: Normocephalic.     Right Ear: Hearing normal.     Left Ear: Hearing normal.  Eyes:     General: Lids are normal.        Right eye: No discharge.        Left eye: No discharge.     Conjunctiva/sclera: Conjunctivae normal.     Pupils: Pupils are equal, round, and reactive to light.  Neck:     Thyroid: No thyromegaly.     Vascular: No carotid bruit.  Cardiovascular:     Rate and  Rhythm: Normal rate and regular rhythm.     Heart sounds: Normal heart sounds. No murmur heard. No gallop.   Pulmonary:     Effort: Pulmonary effort is normal. No accessory muscle usage or respiratory distress.     Breath sounds: Normal breath sounds.  Abdominal:     General: Bowel sounds are normal.     Palpations: Abdomen is soft.  Musculoskeletal:     Cervical back: Normal range of motion and neck supple.     Right lower leg: No edema.     Left lower leg: No edema.  Skin:     General: Skin is warm and dry.  Neurological:     Mental Status: She is alert and oriented to person, place, and time.  Psychiatric:        Attention and Perception: Attention normal.        Mood and Affect: Mood normal.        Speech: Speech normal.        Behavior: Behavior normal. Behavior is cooperative.        Thought Content: Thought content normal.     Results for orders placed or performed in visit on 04/08/20  Microalbumin, Urine Waived  Result Value Ref Range   Microalb, Ur Waived 30 (H) 0 - 19 mg/L   Creatinine, Urine Waived 200 10 - 300 mg/dL   Microalb/Creat Ratio <30 <30 mg/g  CBC with Differential/Platelet  Result Value Ref Range   WBC 5.4 3.4 - 10.8 x10E3/uL   RBC 4.44 3.77 - 5.28 x10E6/uL   Hemoglobin 13.5 11.1 - 15.9 g/dL   Hematocrit 40.8 34.0 - 46.6 %   MCV 92 79 - 97 fL   MCH 30.4 26.6 - 33.0 pg   MCHC 33.1 31.5 - 35.7 g/dL   RDW 13.1 11.7 - 15.4 %   Platelets 149 (L) 150 - 450 x10E3/uL   Neutrophils 43 Not Estab. %   Lymphs 42 Not Estab. %   Monocytes 8 Not Estab. %   Eos 6 Not Estab. %   Basos 1 Not Estab. %   Neutrophils Absolute 2.3 1.4 - 7.0 x10E3/uL   Lymphocytes Absolute 2.3 0.7 - 3.1 x10E3/uL   Monocytes Absolute 0.4 0.1 - 0.9 x10E3/uL   EOS (ABSOLUTE) 0.3 0.0 - 0.4 x10E3/uL   Basophils Absolute 0.1 0.0 - 0.2 x10E3/uL   Immature Granulocytes 0 Not Estab. %   Immature Grans (Abs) 0.0 0.0 - 0.1 x10E3/uL  Comprehensive metabolic panel  Result Value Ref Range   Glucose 103 (H) 65 - 99 mg/dL   BUN 21 8 - 27 mg/dL   Creatinine, Ser 1.01 (H) 0.57 - 1.00 mg/dL   GFR calc non Af Amer 54 (L) >59 mL/min/1.73   GFR calc Af Amer 62 >59 mL/min/1.73   BUN/Creatinine Ratio 21 12 - 28   Sodium 141 134 - 144 mmol/L   Potassium 3.6 3.5 - 5.2 mmol/L   Chloride 103 96 - 106 mmol/L   CO2 26 20 - 29 mmol/L   Calcium 9.0 8.7 - 10.3 mg/dL   Total Protein 6.4 6.0 - 8.5 g/dL   Albumin 3.8 3.7 - 4.7 g/dL   Globulin, Total 2.6 1.5 - 4.5 g/dL   Albumin/Globulin  Ratio 1.5 1.2 - 2.2   Bilirubin Total 0.5 0.0 - 1.2 mg/dL   Alkaline Phosphatase 69 48 - 121 IU/L   AST 20 0 - 40 IU/L   ALT 16 0 - 32 IU/L  Lipid Panel w/o Chol/HDL  Ratio  Result Value Ref Range   Cholesterol, Total 120 100 - 199 mg/dL   Triglycerides 75 0 - 149 mg/dL   HDL 49 >39 mg/dL   VLDL Cholesterol Cal 15 5 - 40 mg/dL   LDL Chol Calc (NIH) 56 0 - 99 mg/dL  TSH  Result Value Ref Range   TSH 0.883 0.450 - 4.500 uIU/mL  HgB A1c  Result Value Ref Range   Hgb A1c MFr Bld 6.6 (H) 4.8 - 5.6 %   Est. average glucose Bld gHb Est-mCnc 143 mg/dL      Assessment & Plan:   Problem List Items Addressed This Visit      Cardiovascular and Mediastinum   Hypertension - Primary    Chronic, stable with BP below goal in office and on home readings.  Continue current medication regimen and collaboration with cardiology.  Continue to monitor BP at home regularly and focus on DASH diet.  CMP and urine ALB today.  Return in 6 months for physical.      Relevant Medications   metoprolol succinate (TOPROL-XL) 50 MG 24 hr tablet   Other Relevant Orders   Comprehensive metabolic panel     Musculoskeletal and Integument   Osteopenia of neck of left femur    Recent scan in 2017 was normal, will plan recheck in November 2022 with mammogram. Check Vitamin D level and continue supplement daily.      Relevant Orders   VITAMIN D 25 Hydroxy (Vit-D Deficiency, Fractures)     Other   Hyperlipidemia    Chronic, ongoing.  Continue current medication regimen and adjust as needed.  Lipid panel today.      Relevant Medications   metoprolol succinate (TOPROL-XL) 50 MG 24 hr tablet   Other Relevant Orders   Lipid Panel w/o Chol/HDL Ratio   Elevated hemoglobin A1c measurement    Continue diet focus, A1c today trended down from 6.6% to 6.1%.  Initiate medication as needed.  Discussed with her due to age guideline less stringent and would not initiate medication unless consistent A1c 8% or greater due to  risks for hypoglycemia and falls.      Relevant Orders   Bayer DCA Hb A1c Waived   Microalbumin, Urine Waived   Obesity    BMI 33.32.  Recommended eating smaller high protein, low fat meals more frequently and exercising 30 mins a day 5 times a week with a goal of 10-15lb weight loss in the next 3 months. Patient voiced their understanding and motivation to adhere to these recommendations.       Proteinuria    With HTN and elevated A1c, prediabetic range.  Urine ALB 30 today.  Continue Olmesartan for kidney protection.          Follow up plan: Return in about 6 months (around 04/16/2021) for Annual physical.

## 2020-10-14 NOTE — Assessment & Plan Note (Signed)
Continue diet focus, A1c today trended down from 6.6% to 6.1%.  Initiate medication as needed.  Discussed with her due to age guideline less stringent and would not initiate medication unless consistent A1c 8% or greater due to risks for hypoglycemia and falls.

## 2020-10-15 ENCOUNTER — Other Ambulatory Visit: Payer: Self-pay | Admitting: Nurse Practitioner

## 2020-10-15 LAB — LIPID PANEL W/O CHOL/HDL RATIO
Cholesterol, Total: 132 mg/dL (ref 100–199)
HDL: 54 mg/dL (ref >39)
LDL Chol Calc (NIH): 65 mg/dL (ref 0–99)
Triglycerides: 62 mg/dL (ref 0–149)
VLDL Cholesterol Cal: 13 mg/dL (ref 5–40)

## 2020-10-15 LAB — COMPREHENSIVE METABOLIC PANEL
ALT: 19 IU/L (ref 0–32)
AST: 25 IU/L (ref 0–40)
Albumin/Globulin Ratio: 1.5 (ref 1.2–2.2)
Albumin: 4 g/dL (ref 3.7–4.7)
Alkaline Phosphatase: 75 IU/L (ref 44–121)
BUN/Creatinine Ratio: 17 (ref 12–28)
BUN: 18 mg/dL (ref 8–27)
Bilirubin Total: 0.4 mg/dL (ref 0.0–1.2)
CO2: 23 mmol/L (ref 20–29)
Calcium: 9.1 mg/dL (ref 8.7–10.3)
Chloride: 104 mmol/L (ref 96–106)
Creatinine, Ser: 1.07 mg/dL — ABNORMAL HIGH (ref 0.57–1.00)
Globulin, Total: 2.6 g/dL (ref 1.5–4.5)
Glucose: 110 mg/dL — ABNORMAL HIGH (ref 65–99)
Potassium: 3.7 mmol/L (ref 3.5–5.2)
Sodium: 144 mmol/L (ref 134–144)
Total Protein: 6.6 g/dL (ref 6.0–8.5)
eGFR: 54 mL/min/{1.73_m2} — ABNORMAL LOW (ref >59)

## 2020-10-15 LAB — VITAMIN D 25 HYDROXY (VIT D DEFICIENCY, FRACTURES): Vit D, 25-Hydroxy: 10.9 ng/mL — ABNORMAL LOW (ref 30.0–100.0)

## 2020-10-15 MED ORDER — CHOLECALCIFEROL 1.25 MG (50000 UT) PO TABS: 1.0000 | ORAL_TABLET | ORAL | 3 refills | Status: DC

## 2020-10-15 NOTE — Progress Notes (Signed)
Contacted via MyChart   Good evening Salsabeel, your labs have returned.  Cholesterol levels remain at goal with your current Rosuvastatin dose, continue this.  Kidney function continues to show some mild kidney disease, but no decline in this and we will continue to monitor.  Vitamin D level is on low side, 10.9.  I am sending in a bigger weekly dose of Vitamin D for you to take for a little bit, then we will recheck next visit and see if we can go back to daily Vitamin D.  For now stop the daily dosing.  This is important for bone health.  Any questions? Keep being awesome!!  Thank you for allowing me to participate in your care. Kindest regards, Tajia Szeliga

## 2021-03-12 DIAGNOSIS — I10 Essential (primary) hypertension: Secondary | ICD-10-CM | POA: Diagnosis not present

## 2021-03-12 DIAGNOSIS — E669 Obesity, unspecified: Secondary | ICD-10-CM | POA: Diagnosis not present

## 2021-03-12 DIAGNOSIS — I349 Nonrheumatic mitral valve disorder, unspecified: Secondary | ICD-10-CM | POA: Diagnosis not present

## 2021-03-12 DIAGNOSIS — R002 Palpitations: Secondary | ICD-10-CM | POA: Diagnosis not present

## 2021-03-12 DIAGNOSIS — E785 Hyperlipidemia, unspecified: Secondary | ICD-10-CM | POA: Diagnosis not present

## 2021-04-21 ENCOUNTER — Encounter: Payer: Federal, State, Local not specified - PPO | Admitting: Nurse Practitioner

## 2021-05-14 ENCOUNTER — Encounter: Payer: Self-pay | Admitting: Nurse Practitioner

## 2021-05-14 DIAGNOSIS — N183 Chronic kidney disease, stage 3 unspecified: Secondary | ICD-10-CM | POA: Insufficient documentation

## 2021-05-14 DIAGNOSIS — I700 Atherosclerosis of aorta: Secondary | ICD-10-CM | POA: Insufficient documentation

## 2021-05-19 ENCOUNTER — Other Ambulatory Visit: Payer: Self-pay

## 2021-05-19 ENCOUNTER — Ambulatory Visit (INDEPENDENT_AMBULATORY_CARE_PROVIDER_SITE_OTHER): Payer: Medicare Other | Admitting: Nurse Practitioner

## 2021-05-19 ENCOUNTER — Encounter: Payer: Self-pay | Admitting: Nurse Practitioner

## 2021-05-19 VITALS — BP 112/70 | HR 70 | Temp 98.4°F | Ht 65.5 in | Wt 201.2 lb

## 2021-05-19 DIAGNOSIS — I10 Essential (primary) hypertension: Secondary | ICD-10-CM

## 2021-05-19 DIAGNOSIS — R7309 Other abnormal glucose: Secondary | ICD-10-CM | POA: Diagnosis not present

## 2021-05-19 DIAGNOSIS — Z0000 Encounter for general adult medical examination without abnormal findings: Secondary | ICD-10-CM

## 2021-05-19 DIAGNOSIS — E66811 Obesity, class 1: Secondary | ICD-10-CM

## 2021-05-19 DIAGNOSIS — R801 Persistent proteinuria, unspecified: Secondary | ICD-10-CM

## 2021-05-19 DIAGNOSIS — E6609 Other obesity due to excess calories: Secondary | ICD-10-CM | POA: Diagnosis not present

## 2021-05-19 DIAGNOSIS — N1831 Chronic kidney disease, stage 3a: Secondary | ICD-10-CM

## 2021-05-19 DIAGNOSIS — E782 Mixed hyperlipidemia: Secondary | ICD-10-CM | POA: Diagnosis not present

## 2021-05-19 DIAGNOSIS — Z6832 Body mass index (BMI) 32.0-32.9, adult: Secondary | ICD-10-CM | POA: Diagnosis not present

## 2021-05-19 DIAGNOSIS — I700 Atherosclerosis of aorta: Secondary | ICD-10-CM

## 2021-05-19 DIAGNOSIS — E559 Vitamin D deficiency, unspecified: Secondary | ICD-10-CM

## 2021-05-19 DIAGNOSIS — Z23 Encounter for immunization: Secondary | ICD-10-CM

## 2021-05-19 MED ORDER — CHLORTHALIDONE 25 MG PO TABS
25.0000 mg | ORAL_TABLET | Freq: Every day | ORAL | 4 refills | Status: DC
Start: 2021-05-19 — End: 2022-06-03

## 2021-05-19 MED ORDER — SHINGRIX 50 MCG/0.5ML IM SUSR: 0.5000 mL | Freq: Once | INTRAMUSCULAR | 0 refills | Status: AC

## 2021-05-19 MED ORDER — HYDROCORTISONE 2.5 % EX CREA
TOPICAL_CREAM | Freq: Two times a day (BID) | CUTANEOUS | 2 refills | Status: DC | PRN
Start: 2021-05-19 — End: 2021-12-09

## 2021-05-19 NOTE — Assessment & Plan Note (Signed)
Noted on recent labs with GFR 54.  Educated patient on findings and findings with her HTN.  Continue Olmesartan for kidney protection with her proteinuria and renal dose medications as needed.  Avoid NSAIDs and contrast unless needed.  CMP today.

## 2021-05-19 NOTE — Assessment & Plan Note (Signed)
With HTN and elevated A1c, prediabetic range.  Urine ALB 30 in March 2022, recheck in March 2023.  Continue Olmesartan for kidney protection.

## 2021-05-19 NOTE — Assessment & Plan Note (Signed)
Noted on imaging 11/21/2017.  Educated patient on this finding.  Recommend continue statin and ASA daily for prevention.

## 2021-05-19 NOTE — Assessment & Plan Note (Signed)
BMI 32.98.  Recommended eating smaller high protein, low fat meals more frequently and exercising 30 mins a day 5 times a week with a goal of 10-15lb weight loss in the next 3 months. Patient voiced their understanding and motivation to adhere to these recommendations.

## 2021-05-19 NOTE — Assessment & Plan Note (Signed)
Chronic, stable with BP below goal in office and on home readings.  Continue current medication regimen and collaboration with cardiology.  Continue to monitor BP at home regularly and focus on DASH diet.  CMP, CBC and TSH today.  Refills sent in.  Return in 6 months for follow-up.

## 2021-05-19 NOTE — Assessment & Plan Note (Signed)
Chronic, ongoing.  Continue current medication regimen and adjust as needed. Lipid panel today. 

## 2021-05-19 NOTE — Assessment & Plan Note (Signed)
Continue diet focus, recheck A1C today. Initiate medication as needed.

## 2021-05-19 NOTE — Assessment & Plan Note (Signed)
With CKD, recheck level today and continue supplement. 

## 2021-05-19 NOTE — Progress Notes (Signed)
BP 112/70   Pulse 70   Temp 98.4 F (36.9 C)   Ht 5' 5.5" (1.664 m)   Wt 201 lb 4 oz (91.3 kg)   LMP  (LMP Unknown)   SpO2 97%   BMI 32.98 kg/m    Subjective:    Patient ID: Denise Morris, female    DOB: 14-Jun-1946, 75 y.o.   MRN: [id number]  HPI: Denise Morris is a 75 y.o. female presenting on 05/19/2021 for comprehensive medical examination. Current medical complaints include:none  She currently lives with: self Menopausal Symptoms: no   HYPERTENSION / HYPERLIPIDEMIA Continues on Olmesartan 40 MG, Metoprolol XL 50 MG, Hygroton 25 MG, and ASA.  Crestor 20 MG for HLD.  Followed by Dr. Humphrey Rolls with cardiology, last saw about 5-6 months ago per her report.  Last echo May 2020 == EF 60%.  She reports recently being prescribed NTG by Dr. Humphrey Rolls, but not using.  Aortic atherosclerosis noted on imaging, CXR in 2019. Satisfied with current treatment? yes Duration of hypertension: chronic BP monitoring frequency: daily BP range: averages 130/70-80 BP medication side effects: no Duration of hyperlipidemia: chronic Cholesterol medication side effects: no Cholesterol supplements: none Medication compliance: good compliance Aspirin: yes Recent stressors: no Recurrent headaches: no Visual changes: no Palpitations: no Dyspnea: no Chest pain: no Lower extremity edema: no Dizzy/lightheaded: no   CHRONIC KIDNEY DISEASE Educated patient on this. CKD status: stable Medications renally dose: yes Previous renal evaluation: yes Pneumovax:  Up to Date Influenza Vaccine:  Up to Date    PREDIABETES A1C in March was 6.1%.  Is focused on diet. Polydipsia/polyuria: no Visual disturbance: no Chest pain: no Paresthesias: no  Depression Screen done today and results listed below:  Depression screen [facility] 2/9 10/14/2020 04/08/2020 03/20/2019 01/01/2019 11/09/2017  Decreased Interest 0 0 0 0 0  Down, Depressed, Hopeless - 0 0 0 0  PHQ - 2 Score 0 0 0 0 0  Altered sleeping - - 1 - -  Tired,  decreased energy - - 0 - -  Change in appetite - - 1 - -  Feeling bad or failure about yourself  - - 0 - -  Trouble concentrating - - 0 - -  Moving slowly or fidgety/restless - - 0 - -  Suicidal thoughts - - 0 - -  PHQ-9 Score - - 2 - -  Difficult doing work/chores - - Not difficult at all - -    Fall Risk  05/19/2021 10/14/2020 04/08/2020 03/20/2019 01/01/2019  Falls in the past year? 0 0 0 0 0  Number falls in past yr: 0 - 0 0 -  Injury with Fall? 0 0 0 0 -  Risk for fall due to : No Fall Risks - No Fall Risks - -  Follow up Education provided - Falls evaluation completed Falls evaluation completed -    Functional Status Survey: Is the patient deaf or have difficulty hearing?: No Does the patient have difficulty seeing, even when wearing glasses/contacts?: No Does the patient have difficulty concentrating, remembering, or making decisions?: No Does the patient have difficulty walking or climbing stairs?: No Does the patient have difficulty dressing or bathing?: No Does the patient have difficulty doing errands alone such as visiting a doctor's office or shopping?: No    Past Medical History:  Past Medical History:  Diagnosis Date   Anxiety    Depression    Edema    Heart palpitations    Hyperlipidemia    Hypertension  Menopausal state    Osteopenia     Surgical History:  Past Surgical History:  Procedure Laterality Date   ABDOMINAL HYSTERECTOMY  1978   Partial ( Precancerous cells)    Medications:  Current Outpatient Medications on File Prior to Visit  Medication Sig   aspirin 81 MG tablet Take 81 mg by mouth daily.   Cholecalciferol 1.25 MG (50000 UT) TABS Take 1 tablet by mouth once a week.   metoprolol succinate (TOPROL-XL) 50 MG 24 hr tablet Take 1 tablet (50 mg total) by mouth daily. Take with or immediately following a meal.   olmesartan (BENICAR) 40 MG tablet Take 40 mg by mouth daily.   rosuvastatin (CRESTOR) 20 MG tablet Take 20 mg by mouth daily.    nitroGLYCERIN (NITROSTAT) 0.3 MG SL tablet 1 tablet as needed.   No current facility-administered medications on file prior to visit.    Allergies:  Allergies  Allergen Reactions   Citalopram Hydrobromide Nausea And Vomiting   Penicillins    Zestril [Lisinopril] Cough    Social History:  Social History   Socioeconomic History   Marital status: Widowed    Spouse name: Not on file   Number of children: Not on file   Years of education: Not on file   Highest education level: Master's degree (e.g., MA, MS, MEng, MEd, MSW, MBA)  Occupational History   Occupation: retired  Tobacco Use   Smoking status: Never   Smokeless tobacco: Never  Vaping Use   Vaping Use: Never used  Substance and Sexual Activity   Alcohol use: No   Drug use: No   Sexual activity: Yes  Other Topics Concern   Not on file  Social History Narrative   [employer]- meet monthly   [employer]- meet monthly   Social Determinants of Health   Financial Resource Strain: Low Risk    Difficulty of Paying Living Expenses: Not hard at all  Food Insecurity: No Food Insecurity   Worried About Charity fundraiser in the Last Year: Never true   [place] in the Last Year: Never true  Transportation Needs: No Transportation Needs   Lack of Transportation (Medical): No   Lack of Transportation (Non-Medical): No  Physical Activity: Insufficiently Active   Days of Exercise per Week: 3 days   Minutes of Exercise per Session: 40 min  Stress: No Stress Concern Present   Feeling of Stress : Not at all  Social Connections: Moderately Integrated   Frequency of Communication with Friends and Family: More than three times a week   Frequency of Social Gatherings with Friends and Family: More than three times a week   Attends Religious Services: More than 4 times per year   Active Member of [employer] or Organizations: Yes   Attends Archivist Meetings: Never   Marital Status: Widowed  Human resources officer Violence: Not At Risk    Fear of Current or Ex-Partner: No   Emotionally Abused: No   Physically Abused: No   Sexually Abused: No   Social History   Tobacco Use  Smoking Status Never  Smokeless Tobacco Never   Social History   Substance and Sexual Activity  Alcohol Use No    Family History:  Family History  Problem Relation Age of Onset   Alzheimer's disease Mother    Hypertension Father    Hypertension Sister    Hyperlipidemia Sister    Breast cancer Neg Hx     Past medical history, surgical history, medications,  allergies, family history and social history reviewed with patient today and changes made to appropriate areas of the chart.   Review of Systems - negative All other ROS negative except what is listed above and in the HPI.      Objective:    BP 112/70   Pulse 70   Temp 98.4 F (36.9 C)   Ht 5' 5.5" (1.664 m)   Wt 201 lb 4 oz (91.3 kg)   LMP  (LMP Unknown)   SpO2 97%   BMI 32.98 kg/m   Wt Readings from Last 3 Encounters:  05/19/21 201 lb 4 oz (91.3 kg)  10/14/20 205 lb 9.6 oz (93.3 kg)  04/08/20 206 lb 9.6 oz (93.7 kg)    Physical Exam Vitals and nursing note reviewed.  Constitutional:      General: She is awake. She is not in acute distress.    Appearance: She is well-developed. She is not ill-appearing.  HENT:     Head: Normocephalic and atraumatic.     Right Ear: Hearing, tympanic membrane, ear canal and external ear normal. No drainage.     Left Ear: Hearing, tympanic membrane, ear canal and external ear normal. No drainage.     Nose: Nose normal.     Right Sinus: No maxillary sinus tenderness or frontal sinus tenderness.     Left Sinus: No maxillary sinus tenderness or frontal sinus tenderness.     Mouth/Throat:     Mouth: Mucous membranes are moist.     Pharynx: Oropharynx is clear. Uvula midline. No pharyngeal swelling, oropharyngeal exudate or posterior oropharyngeal erythema.  Eyes:     General: Lids are normal.        Right eye: No discharge.         Left eye: No discharge.     Extraocular Movements: Extraocular movements intact.     Conjunctiva/sclera: Conjunctivae normal.     Pupils: Pupils are equal, round, and reactive to light.     Visual Fields: Right eye visual fields normal and left eye visual fields normal.  Neck:     Thyroid: No thyromegaly.     Vascular: No carotid bruit.     Trachea: Trachea normal.  Cardiovascular:     Rate and Rhythm: Normal rate and regular rhythm.     Heart sounds: Normal heart sounds. No murmur heard.   No gallop.  Pulmonary:     Effort: Pulmonary effort is normal. No accessory muscle usage or respiratory distress.     Breath sounds: Normal breath sounds.  Chest:  Breasts:    Right: Normal.     Left: Normal.  Abdominal:     General: Bowel sounds are normal.     Palpations: Abdomen is soft. There is no hepatomegaly or splenomegaly.     Tenderness: There is no abdominal tenderness.  Musculoskeletal:        General: Normal range of motion.     Cervical back: Normal range of motion and neck supple.     Right lower leg: No edema.     Left lower leg: No edema.  Lymphadenopathy:     Head:     Right side of head: No submental, submandibular, tonsillar, preauricular or posterior auricular adenopathy.     Left side of head: No submental, submandibular, tonsillar, preauricular or posterior auricular adenopathy.     Cervical: No cervical adenopathy.     Upper Body:     Right upper body: No supraclavicular, axillary or pectoral adenopathy.  Left upper body: No supraclavicular, axillary or pectoral adenopathy.  Skin:    General: Skin is warm and dry.     Capillary Refill: Capillary refill takes less than 2 seconds.     Findings: No rash.  Neurological:     Mental Status: She is alert and oriented to person, place, and time.     Cranial Nerves: Cranial nerves are intact.     Gait: Gait is intact.     Deep Tendon Reflexes: Reflexes are normal and symmetric.     Reflex Scores:       Brachioradialis reflexes are 2+ on the right side and 2+ on the left side.      Patellar reflexes are 2+ on the right side and 2+ on the left side. Psychiatric:        Attention and Perception: Attention normal.        Mood and Affect: Mood normal.        Speech: Speech normal.        Behavior: Behavior normal. Behavior is cooperative.        Thought Content: Thought content normal.        Judgment: Judgment normal.   Results for orders placed or performed in visit on 10/14/20  Bayer DCA Hb A1c Waived  Result Value Ref Range   HB A1C (BAYER DCA - WAIVED) 6.1 <7.0 %  Microalbumin, Urine Waived  Result Value Ref Range   Microalb, Ur Waived 30 (H) 0 - 19 mg/L   Creatinine, Urine Waived 200 10 - 300 mg/dL   Microalb/Creat Ratio <30 <30 mg/g  Lipid Panel w/o Chol/HDL Ratio  Result Value Ref Range   Cholesterol, Total 132 100 - 199 mg/dL   Triglycerides 62 0 - 149 mg/dL   HDL 54 >39 mg/dL   VLDL Cholesterol Cal 13 5 - 40 mg/dL   LDL Chol Calc (NIH) 65 0 - 99 mg/dL  Comprehensive metabolic panel  Result Value Ref Range   Glucose 110 (H) 65 - 99 mg/dL   BUN 18 8 - 27 mg/dL   Creatinine, Ser 1.07 (H) 0.57 - 1.00 mg/dL   eGFR 54 (L) >59 mL/min/1.73   BUN/Creatinine Ratio 17 12 - 28   Sodium 144 134 - 144 mmol/L   Potassium 3.7 3.5 - 5.2 mmol/L   Chloride 104 96 - 106 mmol/L   CO2 23 20 - 29 mmol/L   Calcium 9.1 8.7 - 10.3 mg/dL   Total Protein 6.6 6.0 - 8.5 g/dL   Albumin 4.0 3.7 - 4.7 g/dL   Globulin, Total 2.6 1.5 - 4.5 g/dL   Albumin/Globulin Ratio 1.5 1.2 - 2.2   Bilirubin Total 0.4 0.0 - 1.2 mg/dL   Alkaline Phosphatase 75 44 - 121 IU/L   AST 25 0 - 40 IU/L   ALT 19 0 - 32 IU/L  VITAMIN D 25 Hydroxy (Vit-D Deficiency, Fractures)  Result Value Ref Range   Vit D, 25-Hydroxy 10.9 (L) 30.0 - 100.0 ng/mL      Assessment & Plan:   Problem List Items Addressed This Visit       Cardiovascular and Mediastinum   Hypertension    Chronic, stable with BP below goal in office  and on home readings.  Continue current medication regimen and collaboration with cardiology.  Continue to monitor BP at home regularly and focus on DASH diet.  CMP, CBC and TSH today.  Refills sent in.  Return in 6 months for follow-up.  Relevant Medications   nitroGLYCERIN (NITROSTAT) 0.3 MG SL tablet   chlorthalidone (HYGROTON) 25 MG tablet   Other Relevant Orders   CBC with Differential/Platelet   TSH   Aortic atherosclerosis ([place])    Noted on imaging 11/21/2017.  Educated patient on this finding.  Recommend continue statin and ASA daily for prevention.      Relevant Medications   nitroGLYCERIN (NITROSTAT) 0.3 MG SL tablet   chlorthalidone (HYGROTON) 25 MG tablet     Genitourinary   CKD (chronic kidney disease) stage 3, GFR 30-59 ml/min (HCC) - Primary    Noted on recent labs with GFR 54.  Educated patient on findings and findings with her HTN.  Continue Olmesartan for kidney protection with her proteinuria and renal dose medications as needed.  Avoid NSAIDs and contrast unless needed.  CMP today.      Relevant Orders   TSH     Other   Hyperlipidemia    Chronic, ongoing.  Continue current medication regimen and adjust as needed.  Lipid panel today.      Relevant Medications   nitroGLYCERIN (NITROSTAT) 0.3 MG SL tablet   chlorthalidone (HYGROTON) 25 MG tablet   Other Relevant Orders   Comprehensive metabolic panel   Lipid Panel w/o Chol/HDL Ratio   Elevated hemoglobin A1c measurement    Continue diet focus, recheck A1C today. Initiate medication as needed.      Relevant Orders   HgB A1c   Obesity    BMI 32.98.  Recommended eating smaller high protein, low fat meals more frequently and exercising 30 mins a day 5 times a week with a goal of 10-15lb weight loss in the next 3 months. Patient voiced their understanding and motivation to adhere to these recommendations.       Proteinuria    With HTN and elevated A1c, prediabetic range.  Urine ALB 30 in March 2022,  recheck in March 2023.  Continue Olmesartan for kidney protection.      Vitamin D deficiency    With CKD, recheck level today and continue supplement.      Relevant Orders   VITAMIN D 25 Hydroxy (Vit-D Deficiency, Fractures)   Other Visit Diagnoses     Need for shingles vaccine       Shingrix dose #2 sent in.   Relevant Medications   Zoster Vaccine Adjuvanted [facility]) injection   Encounter for routine adult health examination without abnormal findings            Follow up plan: Return in about 6 months (around 11/17/2021) for HTN/HLD,  CKD.   LABORATORY TESTING:  - Pap smear: not applicable  IMMUNIZATIONS:   - Tdap: Tetanus vaccination status reviewed: will wait until next visit - Influenza: Up to date - Pneumovax: Up to date - Prevnar: Up to date - HPV: Not applicable - Zostavax vaccine: Up to date  SCREENING: -Mammogram: Up to date  - Colonoscopy: age 62 -- would like to not continue - Bone Density: Up to date  -Hearing Test: Not applicable  -Spirometry: Not applicable   PATIENT COUNSELING:   Advised to take 1 mg of folate supplement per day if capable of pregnancy.   Sexuality: Discussed sexually transmitted diseases, partner selection, use of condoms, avoidance of unintended pregnancy  and contraceptive alternatives.   Advised to avoid cigarette smoking.  I discussed with the patient that most people either abstain from alcohol or drink within safe limits (<=14/week and <=4 drinks/occasion for males, <=7/weeks and <= 3 drinks/occasion for  females) and that the risk for alcohol disorders and other health effects rises proportionally with the number of drinks per week and how often a drinker exceeds daily limits.  Discussed cessation/primary prevention of drug use and availability of treatment for abuse.   Diet: Encouraged to adjust caloric intake to maintain  or achieve ideal body weight, to reduce intake of dietary saturated fat and total fat, to limit sodium  intake by avoiding high sodium foods and not adding table salt, and to maintain adequate dietary potassium and calcium preferably from fresh fruits, vegetables, and low-fat dairy products.    Stressed the importance of regular exercise  Injury prevention: Discussed safety belts, safety helmets, smoke detector, smoking near bedding or upholstery.   Dental health: Discussed importance of regular tooth brushing, flossing, and dental visits.    NEXT PREVENTATIVE PHYSICAL DUE IN 1 YEAR. Return in about 6 months (around 11/17/2021) for HTN/HLD,  CKD.

## 2021-05-19 NOTE — Patient Instructions (Signed)

## 2021-05-20 ENCOUNTER — Encounter: Payer: Self-pay | Admitting: Nurse Practitioner

## 2021-05-20 LAB — VITAMIN D 25 HYDROXY (VIT D DEFICIENCY, FRACTURES): Vit D, 25-Hydroxy: 77.1 ng/mL (ref 30.0–100.0)

## 2021-05-20 LAB — COMPREHENSIVE METABOLIC PANEL
ALT: 15 IU/L (ref 0–32)
AST: 21 IU/L (ref 0–40)
Albumin/Globulin Ratio: 1.6 (ref 1.2–2.2)
Albumin: 4.2 g/dL (ref 3.7–4.7)
Alkaline Phosphatase: 72 IU/L (ref 44–121)
BUN/Creatinine Ratio: 17 (ref 12–28)
BUN: 17 mg/dL (ref 8–27)
Bilirubin Total: 0.6 mg/dL (ref 0.0–1.2)
CO2: 25 mmol/L (ref 20–29)
Calcium: 9.8 mg/dL (ref 8.7–10.3)
Chloride: 100 mmol/L (ref 96–106)
Creatinine, Ser: 1.02 mg/dL — ABNORMAL HIGH (ref 0.57–1.00)
Globulin, Total: 2.6 g/dL (ref 1.5–4.5)
Glucose: 100 mg/dL — ABNORMAL HIGH (ref 70–99)
Potassium: 3.6 mmol/L (ref 3.5–5.2)
Sodium: 141 mmol/L (ref 134–144)
Total Protein: 6.8 g/dL (ref 6.0–8.5)
eGFR: 57 mL/min/{1.73_m2} — ABNORMAL LOW (ref >59)

## 2021-05-20 LAB — LIPID PANEL W/O CHOL/HDL RATIO
Cholesterol, Total: 139 mg/dL (ref 100–199)
HDL: 56 mg/dL (ref >39)
LDL Chol Calc (NIH): 66 mg/dL (ref 0–99)
Triglycerides: 91 mg/dL (ref 0–149)
VLDL Cholesterol Cal: 17 mg/dL (ref 5–40)

## 2021-05-20 LAB — CBC WITH DIFFERENTIAL/PLATELET
Basophils Absolute: 0.1 10*3/uL (ref 0.0–0.2)
Basos: 1 %
EOS (ABSOLUTE): 0.3 10*3/uL (ref 0.0–0.4)
Eos: 5 %
Hematocrit: 46.1 % (ref 34.0–46.6)
Hemoglobin: 15.2 g/dL (ref 11.1–15.9)
Immature Grans (Abs): 0 10*3/uL (ref 0.0–0.1)
Immature Granulocytes: 0 %
Lymphocytes Absolute: 2.5 10*3/uL (ref 0.7–3.1)
Lymphs: 48 %
MCH: 30 pg (ref 26.6–33.0)
MCHC: 33 g/dL (ref 31.5–35.7)
MCV: 91 fL (ref 79–97)
Monocytes Absolute: 0.4 10*3/uL (ref 0.1–0.9)
Monocytes: 7 %
Neutrophils Absolute: 2.1 10*3/uL (ref 1.4–7.0)
Neutrophils: 39 %
Platelets: 163 10*3/uL (ref 150–450)
RBC: 5.06 x10E6/uL (ref 3.77–5.28)
RDW: 12.9 % (ref 11.7–15.4)
WBC: 5.2 10*3/uL (ref 3.4–10.8)

## 2021-05-20 LAB — HEMOGLOBIN A1C
Est. average glucose Bld gHb Est-mCnc: 140 mg/dL
Hgb A1c MFr Bld: 6.5 % — ABNORMAL HIGH (ref 4.8–5.6)

## 2021-05-20 LAB — TSH: TSH: 0.755 u[IU]/mL (ref 0.450–4.500)

## 2021-05-20 NOTE — Progress Notes (Signed)
Contacted via [place] -- please call and ensure she received below message:   Good evening Denise Morris, let me know you receive this message, your labs have returned: - CBC remains normal with no anemia and platelets normal this check - Kidney function, creatinine and eGFR, continues to show mild kidney disease but this is a little better then last check.  Continue to get plenty of water and avoid Ibuprofen products. - Thyroid and Vitamin D levels normal - Cholesterol labs are at goal. - A1c, diabetes testing, remains in Type 2 Diabetes range.  This is consider any number 6.5% or greater, last check was 6.6% so it has come down a little.  We have two options, we could start a little medication for this OR you could continue diet focus and cut back on items high in sugar and carbohydrates (like white bread, pasta, rice, potatoes).  At age 75 I would prefer a diet focus vs adding on medications.  What are your thoughts?  Any questions? Keep being amazing!!  Thank you for allowing me to participate in your care.  I appreciate you. Kindest regards, Phillip Sandler

## 2021-05-22 ENCOUNTER — Telehealth: Payer: Self-pay

## 2021-05-22 NOTE — Telephone Encounter (Signed)
Left message for patient to review results and call if she has any questions.

## 2021-05-22 NOTE — Telephone Encounter (Signed)
-----  Message from Venita Lick, NP sent at 05/20/2021  7:10 PM EDT ----- Contacted via MyChart -- please call and ensure she received below message:   Good evening Denise Morris, let me know you receive this message, your labs have returned: - CBC remains normal with no anemia and platelets normal this check - Kidney function, creatinine and eGFR, continues to show mild kidney disease but this is a little better then last check.  Continue to get plenty of water and avoid Ibuprofen products. - Thyroid and Vitamin D levels normal - Cholesterol labs are at goal. - A1c, diabetes testing, remains in Type 2 Diabetes range.  This is consider any number 6.5% or greater, last check was 6.6% so it has come down a little.  We have two options, we could start a little medication for this OR you could continue diet focus and cut back on items high in sugar and carbohydrates (like white bread, pasta, rice, potatoes).  At age 75 I would prefer a diet focus vs adding on medications.  What are your thoughts?  Any questions? Keep being amazing!!  Thank you for allowing me to participate in your care.  I appreciate you. Kindest regards, Jolene

## 2021-06-08 DIAGNOSIS — I340 Nonrheumatic mitral (valve) insufficiency: Secondary | ICD-10-CM | POA: Diagnosis not present

## 2021-06-11 DIAGNOSIS — I10 Essential (primary) hypertension: Secondary | ICD-10-CM | POA: Diagnosis not present

## 2021-06-11 DIAGNOSIS — I351 Nonrheumatic aortic (valve) insufficiency: Secondary | ICD-10-CM | POA: Diagnosis not present

## 2021-06-11 DIAGNOSIS — E785 Hyperlipidemia, unspecified: Secondary | ICD-10-CM | POA: Diagnosis not present

## 2021-06-11 DIAGNOSIS — I340 Nonrheumatic mitral (valve) insufficiency: Secondary | ICD-10-CM | POA: Diagnosis not present

## 2021-06-11 DIAGNOSIS — R002 Palpitations: Secondary | ICD-10-CM | POA: Diagnosis not present

## 2021-06-15 MED ORDER — CHOLECALCIFEROL 1.25 MG (50000 UT) PO TABS: 1.0000 | ORAL_TABLET | ORAL | 3 refills | Status: DC

## 2021-06-30 ENCOUNTER — Encounter: Payer: Self-pay | Admitting: Nurse Practitioner

## 2021-07-08 ENCOUNTER — Telehealth: Payer: Self-pay | Admitting: Nurse Practitioner

## 2021-07-08 NOTE — Telephone Encounter (Signed)
Copied from [place] [id number]. Topic: Medicare AWV >> Jul 08, 2021  3:54 PM Lavonia Drafts wrote: Reason for CRM:  Left message for patient to call back and schedule the Medicare Annual Wellness Visit (AWV) virtually or by telephone.  Last AWV 01/01/19  Please schedule at anytime with [facility]-Nurse Health Advisor.  45 minute appointment  Any questions, please call me at [phone]

## 2021-07-13 ENCOUNTER — Other Ambulatory Visit: Payer: Self-pay | Admitting: Nurse Practitioner

## 2021-07-13 DIAGNOSIS — Z1231 Encounter for screening mammogram for malignant neoplasm of breast: Secondary | ICD-10-CM

## 2021-09-02 ENCOUNTER — Telehealth: Payer: Self-pay | Admitting: Nurse Practitioner

## 2021-09-02 NOTE — Telephone Encounter (Signed)
Copied from [place] [id number]. Topic: Medicare AWV >> Sep 02, 2021  4:18 PM Lavonia Drafts wrote: Reason for CRM: Left message for patient to call back and schedule the Medicare Annual Wellness Visit (AWV) virtually or by telephone.  Last AWV 01/01/19  Please schedule at anytime with [facility]-Nurse Health Advisor.  45 minute appointment  Any questions, please call me at [phone]

## 2021-09-03 ENCOUNTER — Other Ambulatory Visit: Payer: Self-pay

## 2021-09-03 ENCOUNTER — Ambulatory Visit
Admission: RE | Admit: 2021-09-03 | Discharge: 2021-09-03 | Disposition: A | Discharge status: Home / Self Care | Payer: Medicare Other | Source: Ambulatory Visit | Attending: Nurse Practitioner | Admitting: Nurse Practitioner

## 2021-09-03 DIAGNOSIS — Z1231 Encounter for screening mammogram for malignant neoplasm of breast: Secondary | ICD-10-CM | POA: Diagnosis not present

## 2021-09-03 NOTE — Progress Notes (Signed)
Contacted via MyChart   Normal mammogram, may repeat in one year:)

## 2021-09-21 ENCOUNTER — Ambulatory Visit (INDEPENDENT_AMBULATORY_CARE_PROVIDER_SITE_OTHER): Payer: Medicare Other | Admitting: *Deleted

## 2021-09-21 DIAGNOSIS — Z0000 Encounter for general adult medical examination without abnormal findings: Secondary | ICD-10-CM | POA: Diagnosis not present

## 2021-09-21 NOTE — Patient Instructions (Signed)
Denise Morris , Thank you for taking time to come for your Medicare Wellness Visit. I appreciate your ongoing commitment to your health goals. Please review the following plan we discussed and let me know if I can assist you in the future.   Screening recommendations/referrals: Colonoscopy: Education provided Mammogram: up to date Bone Density: Education provided Recommended yearly ophthalmology/optometry visit for glaucoma screening and checkup Recommended yearly dental visit for hygiene and checkup  Vaccinations: Influenza vaccine: up to date Pneumococcal vaccine: up to date Tdap vaccine: up to date Shingles vaccine: up to date    Advanced directives: Education provided  Conditions/risks identified:   Next appointment: 11-17-2021 @ 10:20  Denise Morris 78 Years and Older, Female Preventive care refers to lifestyle choices and visits with your health care provider that can promote health and wellness. What does preventive care include? A yearly physical exam. This is also called an annual well check. Dental exams once or twice a year. Routine eye exams. Ask your health care provider how often you should have your eyes checked. Personal lifestyle choices, including: Daily care of your teeth and gums. Regular physical activity. Eating a healthy diet. Avoiding tobacco and drug use. Limiting alcohol use. Practicing safe sex. Taking low-dose aspirin every day. Taking vitamin and mineral supplements as recommended by your health care provider. What happens during an annual well check? The services and screenings done by your health care provider during your annual well check will depend on your age, overall health, lifestyle risk factors, and family history of disease. Counseling  Your health care provider may ask you questions about your: Alcohol use. Tobacco use. Drug use. Emotional well-being. Home and relationship well-being. Sexual activity. Eating  habits. History of falls. Memory and ability to understand (cognition). Work and work Statistician. Reproductive health. Screening  You may have the following tests or measurements: Height, weight, and BMI. Blood pressure. Lipid and cholesterol levels. These may be checked every 5 years, or more frequently if you are over 72 years old. Skin check. Lung cancer screening. You may have this screening every year starting at age 48 if you have a 30-pack-year history of smoking and currently smoke or have quit within the past 15 years. Fecal occult blood test (FOBT) of the stool. You may have this test every year starting at age 82. Flexible sigmoidoscopy or colonoscopy. You may have a sigmoidoscopy every 5 years or a colonoscopy every 10 years starting at age 64. Hepatitis C blood test. Hepatitis B blood test. Sexually transmitted disease (STD) testing. Diabetes screening. This is done by checking your blood sugar (glucose) after you have not eaten for a while (fasting). You may have this done every 1-3 years. Bone density scan. This is done to screen for osteoporosis. You may have this done starting at age 54. Mammogram. This may be done every 1-2 years. Talk to your health care provider about how often you should have regular mammograms. Talk with your health care provider about your test results, treatment options, and if necessary, the need for more tests. Vaccines  Your health care provider may recommend certain vaccines, such as: Influenza vaccine. This is recommended every year. Tetanus, diphtheria, and acellular pertussis (Tdap, Td) vaccine. You may need a Td booster every 10 years. Zoster vaccine. You may need this after age 85. Pneumococcal 13-valent conjugate (PCV13) vaccine. One dose is recommended after age 50. Pneumococcal polysaccharide (PPSV23) vaccine. One dose is recommended after age 39. Talk to your health care provider  about which screenings and vaccines you need and how  often you need them. This information is not intended to replace advice given to you by your health care provider. Make sure you discuss any questions you have with your health care provider. Document Released: 08/29/2015 Document Revised: 04/21/2016 Document Reviewed: 06/03/2015 Elsevier Interactive Patient Education  2017 [employer] Prevention in the Home Falls can cause injuries. They can happen to people of all ages. There are many things you can do to make your home safe and to help prevent falls. What can I do on the outside of my home? Regularly fix the edges of walkways and driveways and fix any cracks. Remove anything that might make you trip as you walk through a door, such as a raised step or threshold. Trim any bushes or trees on the path to your home. Use bright outdoor lighting. Clear any walking paths of anything that might make someone trip, such as rocks or tools. Regularly check to see if handrails are loose or broken. Make sure that both sides of any steps have handrails. Any raised decks and porches should have guardrails on the edges. Have any leaves, snow, or ice cleared regularly. Use sand or salt on walking paths during winter. Clean up any spills in your garage right away. This includes oil or grease spills. What can I do in the bathroom? Use night lights. Install grab bars by the toilet and in the tub and shower. Do not use towel bars as grab bars. Use non-skid mats or decals in the tub or shower. If you need to sit down in the shower, use a plastic, non-slip stool. Keep the floor dry. Clean up any water that spills on the floor as soon as it happens. Remove soap buildup in the tub or shower regularly. Attach bath mats securely with double-sided non-slip rug tape. Do not have throw rugs and other things on the floor that can make you trip. What can I do in the bedroom? Use night lights. Make sure that you have a light by your bed that is easy to  reach. Do not use any sheets or blankets that are too big for your bed. They should not hang down onto the floor. Have a firm chair that has side arms. You can use this for support while you get dressed. Do not have throw rugs and other things on the floor that can make you trip. What can I do in the kitchen? Clean up any spills right away. Avoid walking on wet floors. Keep items that you use a lot in easy-to-reach places. If you need to reach something above you, use a strong step stool that has a grab bar. Keep electrical cords out of the way. Do not use floor polish or wax that makes floors slippery. If you must use wax, use non-skid floor wax. Do not have throw rugs and other things on the floor that can make you trip. What can I do with my stairs? Do not leave any items on the stairs. Make sure that there are handrails on both sides of the stairs and use them. Fix handrails that are broken or loose. Make sure that handrails are as long as the stairways. Check any carpeting to make sure that it is firmly attached to the stairs. Fix any carpet that is loose or worn. Avoid having throw rugs at the top or bottom of the stairs. If you do have throw rugs, attach them to the floor  with carpet tape. Make sure that you have a light switch at the top of the stairs and the bottom of the stairs. If you do not have them, ask someone to add them for you. What else can I do to help prevent falls? Wear shoes that: Do not have high heels. Have rubber bottoms. Are comfortable and fit you well. Are closed at the toe. Do not wear sandals. If you use a stepladder: Make sure that it is fully opened. Do not climb a closed stepladder. Make sure that both sides of the stepladder are locked into place. Ask someone to hold it for you, if possible. Clearly mark and make sure that you can see: Any grab bars or handrails. First and last steps. Where the edge of each step is. Use tools that help you move  around (mobility aids) if they are needed. These include: Canes. Walkers. Scooters. Crutches. Turn on the lights when you go into a dark area. Replace any light bulbs as soon as they burn out. Set up your furniture so you have a clear path. Avoid moving your furniture around. If any of your floors are uneven, fix them. If there are any pets around you, be aware of where they are. Review your medicines with your doctor. Some medicines can make you feel dizzy. This can increase your chance of falling. Ask your doctor what other things that you can do to help prevent falls. This information is not intended to replace advice given to you by your health care provider. Make sure you discuss any questions you have with your health care provider. Document Released: 05/29/2009 Document Revised: 01/08/2016 Document Reviewed: 09/06/2014 Elsevier Interactive Patient Education  2017 [employer].

## 2021-09-21 NOTE — Progress Notes (Signed)
Subjective:   Denise Morris is a 76 y.o. female who presents for Medicare Annual (Subsequent) preventive examination.  I connected with  [name] on 09/21/21 by a telephone enabled telemedicine application and verified that I am speaking with the correct person using two identifiers.   I discussed the limitations of evaluation and management by telemedicine. The patient expressed understanding and agreed to proceed.  Patient location: home  Provider location: Tele-Health not in office    Review of Systems     Cardiac Risk Factors include: advanced age (>82men, >1 women);hypertension;sedentary lifestyle     Objective:    Today's Vitals   There is no height or weight on file to calculate BMI.  Advanced Directives 09/21/2021 09/21/2021 01/01/2019 10/22/2018 11/09/2017 10/19/2016 10/19/2016  Does Patient Have a Medical Advance Directive? Yes Yes Yes Yes Yes - Yes  Type of Advance Directive Healthcare Power of [place];Living will [place];Living will - Living will;Healthcare Power of Attorney  Does patient want to make changes to medical advance directive? - No - Patient declined - - - - -  Copy of [place] in Chart? No - copy requested No - copy requested No - copy requested - No - copy requested No - copy requested Yes    Current Medications (verified) Outpatient Encounter Medications as of 09/21/2021  Medication Sig   aspirin 81 MG tablet Take 81 mg by mouth daily.   chlorthalidone (HYGROTON) 25 MG tablet Take 1 tablet (25 mg total) by mouth daily.   Cholecalciferol 1.25 MG (50000 UT) TABS Take 1 tablet by mouth once a week.   hydrocortisone 2.5 % cream Apply topically 2 (two) times daily as needed.   metoprolol succinate (TOPROL-XL) 50 MG 24 hr tablet Take 1 tablet (50 mg total) by mouth daily. Take with or immediately following a meal.    nitroGLYCERIN (NITROSTAT) 0.3 MG SL tablet 1 tablet as needed.   olmesartan (BENICAR) 40 MG tablet Take 40 mg by mouth daily.   rosuvastatin (CRESTOR) 20 MG tablet Take 20 mg by mouth daily.   No facility-administered encounter medications on file as of 09/21/2021.    Allergies (verified) Citalopram hydrobromide, Penicillins, and Zestril [lisinopril]   History: Past Medical History:  Diagnosis Date   Anxiety    Depression    Edema    Heart palpitations    Hyperlipidemia    Hypertension    Menopausal state    Osteopenia    Past Surgical History:  Procedure Laterality Date   ABDOMINAL HYSTERECTOMY  1978   Partial ( Precancerous cells)   Family History  Problem Relation Age of Onset   Alzheimer's disease Mother    Hypertension Father    Hypertension Sister    Hyperlipidemia Sister    Breast cancer Neg Hx    Social History   Socioeconomic History   Marital status: Widowed    Spouse name: Not on file   Number of children: Not on file   Years of education: Not on file   Highest education level: Master's degree (e.g., MA, MS, MEng, MEd, MSW, MBA)  Occupational History   Occupation: retired  Tobacco Use   Smoking status: Never   Smokeless tobacco: Never  Vaping Use   Vaping Use: Never used  Substance and Sexual Activity   Alcohol use: No   Drug use: No   Sexual activity: Yes  Other Topics Concern  Not on file  Social History Narrative   [employer]AR[employer]- meet monthly   [employer]- meet monthly   Social Determinants of Health   Financial Resource Strain: Low Risk    Difficulty of Paying Living Expenses: Not hard at all  Food Insecurity: No Food Insecurity   Worried About Charity fundraiser in the Last Year: Never true   Emporium in the Last Year: Never true  Transportation Needs: No Transportation Needs   Lack of Transportation (Medical): No   Lack of Transportation (Non-Medical): No  Physical Activity: Inactive   Days of Exercise per Week: 3 days   Minutes of  Exercise per Session: 0 min  Stress: No Stress Concern Present   Feeling of Stress : Not at all  Social Connections: Moderately Integrated   Frequency of Communication with Friends and Family: More than three times a week   Frequency of Social Gatherings with Friends and Family: More than three times a week   Attends Religious Services: More than 4 times per year   Active Member of [employer] or Organizations: Yes   Attends Archivist Meetings: Never   Marital Status: Widowed    Tobacco Counseling Counseling given: Not Answered   Clinical Intake:  Pre-visit preparation completed: Yes  Pain : No/denies pain     Nutritional Risks: None Diabetes: No  How often do you need to have someone help you when you read instructions, pamphlets, or other written materials from your doctor or pharmacy?: 1 - Never  Diabetic?  no  Interpreter Needed?: No  Information entered by :: Leroy Kennedy LPN   Activities of Daily Living In your present state of health, do you have any difficulty performing the following activities: 09/21/2021 05/19/2021  Hearing? N N  Vision? N N  Difficulty concentrating or making decisions? N N  Walking or climbing stairs? N N  Dressing or bathing? N N  Doing errands, shopping? N N  Preparing Food and eating ? N -  Using the Toilet? N -  In the past six months, have you accidently leaked urine? N -  Do you have problems with loss of bowel control? N -  Managing your Medications? N -  Managing your Finances? N -  Housekeeping or managing your Housekeeping? N -  Some recent data might be hidden    Patient Care Team: Venita Lick, NP as PCP - General (Nurse Practitioner) Dionisio David, MD as Consulting Physician (Cardiology) Idelle Leech, [place] (Optometry) Vanita Ingles, RN as Case Manager (General Practice)  Indicate any recent Medical Services you may have received from other than [facility] providers in the past year (date may be approximate).      Assessment:   This is a routine wellness examination for Denise Morris.  Hearing/Vision screen Hearing Screening - Comments:: No trouble hearing Vision Screening - Comments:: Up to date [place]  Dietary issues and exercise activities discussed: Current Exercise Habits: The patient does not participate in regular exercise at present, Intensity: Not Applicable, Exercise limited by: None identified   Goals Addressed             This Visit's Progress    DIET - INCREASE WATER INTAKE   On track    Recommend drinking at least 6-8 glasses of water a day      Increase physical activity         Depression Screen PHQ 2/9 Scores 09/21/2021 10/14/2020 04/08/2020 03/20/2019 01/01/2019 11/09/2017 10/19/2016  PHQ - 2  Score 0 0 0 0 0 0 0  PHQ- 9 Score - - - 2 - - 1    Fall Risk Fall Risk  09/21/2021 05/19/2021 10/14/2020 04/08/2020 03/20/2019  Falls in the past year? 0 0 0 0 0  Number falls in past yr: 0 0 - 0 0  Injury with Fall? 0 0 0 0 0  Risk for fall due to : - No Fall Risks - No Fall Risks -  Follow up Falls evaluation completed;Falls prevention discussed Education provided - Falls evaluation completed Falls evaluation completed    FALL RISK PREVENTION PERTAINING TO THE HOME:  Any stairs in or around the home? No  If so, are there any without handrails? No  Home free of loose throw rugs in walkways, pet beds, electrical cords, etc? Yes  Adequate lighting in your home to reduce risk of falls? Yes   ASSISTIVE DEVICES UTILIZED TO PREVENT FALLS:  Life alert? No  Use of a cane, walker or w/c? No  Grab bars in the bathroom? Yes  Shower chair or bench in shower? Yes  Elevated toilet seat or a handicapped toilet? No   TIMED UP AND GO:  Was the test performed? No .    Cognitive Function:  Normal cognitive status assessed by direct observation by this Nurse Health Advisor. No abnormalities found.       6CIT Screen 01/01/2019 11/09/2017  What Year? 0 points 0 points  What month? 0 points 0 points   What time? 0 points 0 points  Count back from 20 0 points 0 points  Months in reverse 0 points 0 points  Repeat phrase 0 points 0 points  Total Score 0 0    Immunizations Immunization History  Administered Date(s) Administered   Fluad Quad(high Dose 65+) 06/04/2019   Influenza, High Dose Seasonal PF 05/31/2018   Influenza-Unspecified 05/31/2021   PFIZER Comirnaty(Gray Top)Covid-19 Tri-Sucrose Vaccine 12/08/2020   PFIZER(Purple Top)SARS-COV-2 Vaccination 09/10/2019, 10/01/2019, 05/22/2020, 06/21/2021   Pfizer Covid-19 Vaccine Bivalent Booster 58yrs & up 06/21/2021   Pneumococcal Conjugate-13 08/13/2014   Pneumococcal Polysaccharide-23 04/06/2011   Td 03/20/2003   Zoster Recombinat (Shingrix) 11/21/2017, 05/24/2021   Zoster, Live 04/04/2007    TDAP status: Up to date  Flu Vaccine status: Up to date  Pneumococcal vaccine status: Up to date  Covid-19 vaccine status: Completed vaccines  Qualifies for Shingles Vaccine? No   Zostavax completed Yes   Shingrix Completed?: Yes  Screening Tests Health Maintenance  Topic Date Due   FOOT EXAM  Never done   OPHTHALMOLOGY EXAM  Never done   TETANUS/TDAP  05/19/2022 (Originally 03/19/2013)   HEMOGLOBIN A1C  11/17/2021   Pneumonia Vaccine 39+ Years old  Completed   INFLUENZA VACCINE  Completed   DEXA SCAN  Completed   COVID-19 Vaccine  Completed   Hepatitis C Screening  Completed   Zoster Vaccines- Shingrix  Completed   HPV VACCINES  Aged Out   COLONOSCOPY (Pts 45-6yrs Insurance coverage will need to be confirmed)  Discontinued    Health Maintenance  Health Maintenance Due  Topic Date Due   FOOT EXAM  Never done   OPHTHALMOLOGY EXAM  Never done    Colorectal cancer screening: Type of screening: Colonoscopy. Completed 2017. Repeat every 5 years  will speak to MD at appointment  Mammogram status: Completed 2023. Repeat every year  Bone Density status: Completed 2017. Results reflect: Bone density results: NORMAL. Repeat  every 0 years.  Lung Cancer Screening: (Low Dose CT Chest recommended  if Age 33-80 years, 58 pack-year currently smoking OR have quit w/in 15years.) does not qualify.   Lung Cancer Screening Referral:   Additional Screening:  Hepatitis C Screening: does not qualify; Completed 2017  Vision Screening: Recommended annual ophthalmology exams for early detection of glaucoma and other disorders of the eye. Is the patient up to date with their annual eye exam?  Yes  Who is the provider or what is the name of the office in which the patient attends annual eye exams? [facility] If pt is not established with a provider, would they like to be referred to a provider to establish care? No .   Dental Screening: Recommended annual dental exams for proper oral hygiene  Community Resource Referral / Chronic Care Management: CRR required this visit?  No   CCM required this visit?  No      Plan:     I have personally reviewed and noted the following in the patients chart:   Medical and social history Use of alcohol, tobacco or illicit drugs  Current medications and supplements including opioid prescriptions.  Functional ability and status Nutritional status Physical activity Advanced directives List of other physicians Hospitalizations, surgeries, and ER visits in previous 12 months Vitals Screenings to include cognitive, depression, and falls Referrals and appointments  In addition, I have reviewed and discussed with patient certain preventive protocols, quality metrics, and best practice recommendations. A written personalized care plan for preventive services as well as general preventive health recommendations were provided to patient.     Leroy Kennedy, LPN   12/17/6268   Nurse Notes:

## 2021-09-24 DIAGNOSIS — Z20822 Contact with and (suspected) exposure to covid-19: Secondary | ICD-10-CM | POA: Diagnosis not present

## 2021-10-12 DIAGNOSIS — E669 Obesity, unspecified: Secondary | ICD-10-CM | POA: Diagnosis not present

## 2021-10-12 DIAGNOSIS — I340 Nonrheumatic mitral (valve) insufficiency: Secondary | ICD-10-CM | POA: Diagnosis not present

## 2021-10-12 DIAGNOSIS — R002 Palpitations: Secondary | ICD-10-CM | POA: Diagnosis not present

## 2021-10-12 DIAGNOSIS — I10 Essential (primary) hypertension: Secondary | ICD-10-CM | POA: Diagnosis not present

## 2021-10-12 DIAGNOSIS — I351 Nonrheumatic aortic (valve) insufficiency: Secondary | ICD-10-CM | POA: Diagnosis not present

## 2021-10-12 DIAGNOSIS — E785 Hyperlipidemia, unspecified: Secondary | ICD-10-CM | POA: Diagnosis not present

## 2021-11-17 ENCOUNTER — Ambulatory Visit: Payer: Medicare Other | Admitting: Nurse Practitioner

## 2021-11-17 ENCOUNTER — Other Ambulatory Visit: Payer: Self-pay | Admitting: Nurse Practitioner

## 2021-11-18 NOTE — Telephone Encounter (Signed)
Requested Prescriptions  ?Pending Prescriptions Disp Refills  ?? metoprolol succinate (TOPROL-XL) 50 MG 24 hr tablet [Pharmacy Med Name: METOPROLOL SUCC ER 50 MG TAB] 90 tablet 0  ?  Sig: TAKE 1 TABLET BY MOUTH DAILY. TAKE WITH OR IMMEDIATELY FOLLOWING A MEAL.  ?  ? Cardiovascular:  Beta Blockers Passed - 11/17/2021  8:09 PM  ?  ?  Passed - Last BP in normal range  ?  BP Readings from Last 1 Encounters:  ?05/19/21 112/70  ?   ?  ?  Passed - Last Heart Rate in normal range  ?  Pulse Readings from Last 1 Encounters:  ?05/19/21 70  ?   ?  ?  Passed - Valid encounter within last 6 months  ?  Recent Outpatient Visits   ?      ? 6 months ago Stage 3a chronic kidney disease ([place])  ? [place], Henrine Screws T, NP  ? 1 year ago Primary hypertension  ? [place], Henrine Screws T, NP  ? 1 year ago Routine general medical examination at a health care facility  ? [place], Henrine Screws T, NP  ? 2 years ago Essential hypertension  ? [place], Henrine Screws T, NP  ? 2 years ago Encounter for annual physical exam  ? [place], Barbaraann Faster, NP  ?  ?  ?Future Appointments   ?        ? In 3 weeks [place], Barbaraann Faster, NP [employer]  ?  ? ?  ?  ?  ? ?

## 2021-11-25 DIAGNOSIS — Z20822 Contact with and (suspected) exposure to covid-19: Secondary | ICD-10-CM | POA: Diagnosis not present

## 2021-12-06 NOTE — Patient Instructions (Signed)

## 2021-12-09 ENCOUNTER — Ambulatory Visit (INDEPENDENT_AMBULATORY_CARE_PROVIDER_SITE_OTHER): Payer: Medicare Other | Admitting: Nurse Practitioner

## 2021-12-09 ENCOUNTER — Encounter: Payer: Self-pay | Admitting: Nurse Practitioner

## 2021-12-09 VITALS — BP 103/63 | HR 59 | Temp 98.6°F | Ht 65.5 in | Wt 204.6 lb

## 2021-12-09 DIAGNOSIS — E669 Obesity, unspecified: Secondary | ICD-10-CM | POA: Diagnosis not present

## 2021-12-09 DIAGNOSIS — R809 Proteinuria, unspecified: Secondary | ICD-10-CM

## 2021-12-09 DIAGNOSIS — E6609 Other obesity due to excess calories: Secondary | ICD-10-CM

## 2021-12-09 DIAGNOSIS — E1159 Type 2 diabetes mellitus with other circulatory complications: Secondary | ICD-10-CM

## 2021-12-09 DIAGNOSIS — I152 Hypertension secondary to endocrine disorders: Secondary | ICD-10-CM

## 2021-12-09 DIAGNOSIS — E1129 Type 2 diabetes mellitus with other diabetic kidney complication: Secondary | ICD-10-CM

## 2021-12-09 DIAGNOSIS — E785 Hyperlipidemia, unspecified: Secondary | ICD-10-CM

## 2021-12-09 DIAGNOSIS — N1831 Chronic kidney disease, stage 3a: Secondary | ICD-10-CM | POA: Diagnosis not present

## 2021-12-09 DIAGNOSIS — E538 Deficiency of other specified B group vitamins: Secondary | ICD-10-CM | POA: Diagnosis not present

## 2021-12-09 DIAGNOSIS — I700 Atherosclerosis of aorta: Secondary | ICD-10-CM | POA: Diagnosis not present

## 2021-12-09 DIAGNOSIS — Z6832 Body mass index (BMI) 32.0-32.9, adult: Secondary | ICD-10-CM

## 2021-12-09 DIAGNOSIS — E1169 Type 2 diabetes mellitus with other specified complication: Secondary | ICD-10-CM | POA: Diagnosis not present

## 2021-12-09 LAB — BAYER DCA HB A1C WAIVED: HB A1C (BAYER DCA - WAIVED): 6.3 % — ABNORMAL HIGH (ref 4.8–5.6)

## 2021-12-09 LAB — MICROALBUMIN, URINE WAIVED
Creatinine, Urine Waived: 200 mg/dL (ref 10–300)
Microalb, Ur Waived: 30 mg/L — ABNORMAL HIGH (ref 0–19)
Microalb/Creat Ratio: <30 mg/g (ref <30)

## 2021-12-09 MED ORDER — HYDROCORTISONE 2.5 % EX CREA: TOPICAL_CREAM | Freq: Two times a day (BID) | CUTANEOUS | 2 refills | Status: DC | PRN

## 2021-12-09 MED ORDER — CHOLECALCIFEROL 1.25 MG (50000 UT) PO TABS: 1.0000 | ORAL_TABLET | ORAL | 3 refills | Status: DC

## 2021-12-09 NOTE — Assessment & Plan Note (Signed)
Chronic, stable with BP below goal in office and on home readings.  Continue current medication regimen and collaboration with cardiology.  Continue to monitor BP at home regularly and focus on DASH diet.  CMP and urine ALB today.  Refills sent in.  Return in 6 months for follow-up. ?

## 2021-12-09 NOTE — Assessment & Plan Note (Signed)
Noted on imaging 11/21/2017.  Educated patient on this finding.  Recommend continue statin and ASA daily for prevention. ?

## 2021-12-09 NOTE — Assessment & Plan Note (Signed)
BMI 33.53.  Recommended eating smaller high protein, low fat meals more frequently and exercising 30 mins a day 5 times a week with a goal of 10-15lb weight loss in the next 3 months. Patient voiced their understanding and motivation to adhere to these recommendations. ? ?

## 2021-12-09 NOTE — Assessment & Plan Note (Signed)
Chronic, ongoing.  Continue current medication regimen and adjust as needed. Lipid panel today. 

## 2021-12-09 NOTE — Assessment & Plan Note (Signed)
Chronic, ongoing.  Discussed further today diagnosis with patient and reviewed past A1c levels.  Currently diet controlled, continue this and monitor.  A1c today 6.3%, trend down from previous 6.5% and 6.6%.  Recommend she obtain eye exam.  Return in 6 months. ?

## 2021-12-09 NOTE — Assessment & Plan Note (Signed)
Chronic, ongoing.  Discussed further today diagnosis with patient and reviewed past A1c levels.  Currently diet controlled, continue this and monitor.  A1c today 6.3%, trend down from previous 6.5% and 6.6%.  Check urine ALB today.  Recommend she obtain eye exam.  Return in 6 months. ?

## 2021-12-09 NOTE — Progress Notes (Signed)
? ?BP 103/63   Pulse (!) 59   Temp 98.6 ?F (37 ?C) (Oral)   Ht 5' 5.5" (1.664 m)   Wt 204 lb 9.6 oz (92.8 kg)   LMP  (LMP Unknown)   SpO2 96%   BMI 33.53 kg/m?   ? ?Subjective:  ? ? Patient ID: Denise Morris, female    DOB: 11/26/1945, 76 y.o.   MRN: [id number] ? ?HPI: ?Denise Morris is a 76 y.o. female ? ?Chief Complaint  ?Patient presents with  ? Hyperlipidemia  ? Hypertension  ? Chronic Kidney Disease  ? Diabetes  ?  Patient states she is scheduled for an Diabetic Eye Exam 01/07/22.  ? Medication Refill  ?  Patient is requesting refills on her Vitamin D3 and Hydrocortisone cream at today's visit.   ? ?DIABETES ?A1c last visit 6.5% in October, currently diet controlled. ?Hypoglycemic episodes:no ?Polydipsia/polyuria: no ?Visual disturbance: no ?Chest pain: no ?Paresthesias: no ?Glucose Monitoring: no ? Accucheck frequency: Not Checking ? Fasting glucose: ? Post prandial: ? Evening: ? Before meals: ?Taking Insulin?: no ? Long acting insulin: ? Short acting insulin: ?Blood Pressure Monitoring: a few times a week ?Retinal Examination:  has appointment May 25th with Dr. Matilde Sprang ?Foot Exam: Up to Date ?Pneumovax: Up to Date ?Influenza: Up to Date ?Aspirin: yes  ? ?HYPERTENSION / HYPERLIPIDEMIA ?Continues on Olmesartan 40 MG, Metoprolol XL 50 MG, Hygroton 25 MG, and ASA.  Crestor 20 MG for HLD.  Followed by Dr. Humphrey Rolls with cardiology, last saw 10/12/21.  Last echo on chart May 2020 == EF 60% = she reports she had a repeat in February and this did not change. ?  ?Aortic atherosclerosis noted on imaging, CXR in 2019. ?Satisfied with current treatment? yes ?Duration of hypertension: chronic ?BP monitoring frequency: daily ?BP range: averages 130/70-80 ?BP medication side effects: no ?Duration of hyperlipidemia: chronic ?Cholesterol medication side effects: no ?Cholesterol supplements: none ?Medication compliance: good compliance ?Aspirin: yes ?Recent stressors: no ?Recurrent headaches: no ?Visual changes:  no ?Palpitations: no ?Dyspnea: no ?Chest pain: no ?Lower extremity edema: no ?Dizzy/lightheaded: no  ?  ?CHRONIC KIDNEY DISEASE ?Educated patient on this. ?CKD status: stable ?Medications renally dose: yes ?Previous renal evaluation: yes ?Pneumovax:  Up to Date ?Influenza Vaccine:  Up to Date  ?  ?Relevant past medical, surgical, family and social history reviewed and updated as indicated. Interim medical history since our last visit reviewed. ?Allergies and medications reviewed and updated. ? ?Review of Systems  ?Constitutional:  Negative for activity change, appetite change, diaphoresis, fatigue and fever.  ?Respiratory:  Negative for cough, chest tightness, shortness of breath and wheezing.   ?Cardiovascular:  Negative for chest pain, palpitations and leg swelling.  ?Gastrointestinal: Negative.   ?Endocrine: Negative for polydipsia, polyphagia and polyuria.  ?Neurological: Negative.   ?Psychiatric/Behavioral: Negative.    ? ?Per HPI unless specifically indicated above ? ?   ?Objective:  ?  ?BP 103/63   Pulse (!) 59   Temp 98.6 ?F (37 ?C) (Oral)   Ht 5' 5.5" (1.664 m)   Wt 204 lb 9.6 oz (92.8 kg)   LMP  (LMP Unknown)   SpO2 96%   BMI 33.53 kg/m?   ?Wt Readings from Last 3 Encounters:  ?12/09/21 204 lb 9.6 oz (92.8 kg)  ?05/19/21 201 lb 4 oz (91.3 kg)  ?10/14/20 205 lb 9.6 oz (93.3 kg)  ?  ?Physical Exam ?Vitals and nursing note reviewed.  ?Constitutional:   ?   General: She is awake. She is not  in acute distress. ?   Appearance: She is well-developed and well-groomed. She is obese. She is not ill-appearing.  ?HENT:  ?   Head: Normocephalic.  ?   Right Ear: Hearing normal.  ?   Left Ear: Hearing normal.  ?Eyes:  ?   General: Lids are normal.     ?   Right eye: No discharge.     ?   Left eye: No discharge.  ?   Conjunctiva/sclera: Conjunctivae normal.  ?   Pupils: Pupils are equal, round, and reactive to light.  ?Neck:  ?   Thyroid: No thyromegaly.  ?   Vascular: No carotid bruit.  ?Cardiovascular:  ?   Rate  and Rhythm: Normal rate and regular rhythm.  ?   Heart sounds: Normal heart sounds. No murmur heard. ?  No gallop.  ?Pulmonary:  ?   Effort: Pulmonary effort is normal. No accessory muscle usage or respiratory distress.  ?   Breath sounds: Normal breath sounds.  ?Abdominal:  ?   General: Bowel sounds are normal.  ?   Palpations: Abdomen is soft.  ?Musculoskeletal:  ?   Cervical back: Normal range of motion and neck supple.  ?   Right lower leg: No edema.  ?   Left lower leg: No edema.  ?Skin: ?   General: Skin is warm and dry.  ?Neurological:  ?   Mental Status: She is alert and oriented to person, place, and time.  ?Psychiatric:     ?   Attention and Perception: Attention normal.     ?   Mood and Affect: Mood normal.     ?   Speech: Speech normal.     ?   Behavior: Behavior normal. Behavior is cooperative.     ?   Thought Content: Thought content normal.  ? ? ?Results for orders placed or performed in visit on 12/09/21  ?Bayer DCA Hb A1c Waived  ?Result Value Ref Range  ? HB A1C (BAYER DCA - WAIVED) 6.3 (H) 4.8 - 5.6 %  ?Microalbumin, Urine Waived  ?Result Value Ref Range  ? Microalb, Ur Waived 30 (H) 0 - 19 mg/L  ? Creatinine, Urine Waived 200 10 - 300 mg/dL  ? Microalb/Creat Ratio <30 <30 mg/g  ? ?   ?Assessment & Plan:  ? ?Problem List Items Addressed This Visit   ? ?  ? Cardiovascular and Mediastinum  ? Aortic atherosclerosis ([place])  ?  Noted on imaging 11/21/2017.  Educated patient on this finding.  Recommend continue statin and ASA daily for prevention. ? ?  ?  ? Relevant Orders  ? Comprehensive metabolic panel  ? Lipid Panel w/o Chol/HDL Ratio  ? Hypertension associated with diabetes ([place])  ?  Chronic, stable with BP below goal in office and on home readings.  Continue current medication regimen and collaboration with cardiology.  Continue to monitor BP at home regularly and focus on DASH diet.  CMP and urine ALB today.  Refills sent in.  Return in 6 months for follow-up. ? ?  ?  ? Relevant Orders  ? Bayer DCA  Hb A1c Waived (Completed)  ? Microalbumin, Urine Waived (Completed)  ? Comprehensive metabolic panel  ?  ? Endocrine  ? Hyperlipidemia associated with type 2 diabetes mellitus ([place])  ?  Chronic, ongoing.  Continue current medication regimen and adjust as needed.  Lipid panel today. ? ?  ?  ? Relevant Orders  ? Bayer DCA Hb A1c Waived (Completed)  ? Comprehensive  metabolic panel  ? Lipid Panel w/o Chol/HDL Ratio  ? Type 2 diabetes mellitus with obesity ([place]) - Primary  ?  Chronic, ongoing.  Discussed further today diagnosis with patient and reviewed past A1c levels.  Currently diet controlled, continue this and monitor.  A1c today 6.3%, trend down from previous 6.5% and 6.6%.  Recommend she obtain eye exam.  Return in 6 months. ? ?  ?  ? Relevant Orders  ? Bayer DCA Hb A1c Waived (Completed)  ? Microalbumin, Urine Waived (Completed)  ? Type 2 diabetes mellitus with proteinuria ([facility])  ?  Chronic, ongoing.  Discussed further today diagnosis with patient and reviewed past A1c levels.  Currently diet controlled, continue this and monitor.  A1c today 6.3%, trend down from previous 6.5% and 6.6%.  Check urine ALB today.  Recommend she obtain eye exam.  Return in 6 months. ?  ?  ? Relevant Orders  ? Microalbumin, Urine Waived (Completed)  ? Comprehensive metabolic panel  ?  ? Genitourinary  ? CKD (chronic kidney disease) stage 3, GFR 30-59 ml/min ([facility])  ?  Noted on recent labs with GFR 57.  Educated patient on findings and findings with her HTN.  Continue Olmesartan for kidney protection with her proteinuria and renal dose medications as needed.  Avoid NSAIDs and contrast unless needed.  CMP and urine ALB today. ? ?  ?  ? Relevant Orders  ? Microalbumin, Urine Waived (Completed)  ? Comprehensive metabolic panel  ?  ? Other  ? Obesity  ?  BMI 33.53.  Recommended eating smaller high protein, low fat meals more frequently and exercising 30 mins a day 5 times a week with a goal of 10-15lb weight loss in the next 3 months.  Patient voiced their understanding and motivation to adhere to these recommendations. ? ? ?  ?  ? ?Other Visit Diagnoses   ? ? B12 deficiency      ? History of low levels reported, check today and start supplement as needed.  ? [place]

## 2021-12-09 NOTE — Assessment & Plan Note (Signed)
Noted on recent labs with GFR 57.  Educated patient on findings and findings with her HTN.  Continue Olmesartan for kidney protection with her proteinuria and renal dose medications as needed.  Avoid NSAIDs and contrast unless needed.  CMP and urine ALB today. ?

## 2021-12-10 LAB — COMPREHENSIVE METABOLIC PANEL
ALT: 13 IU/L (ref 0–32)
AST: 21 IU/L (ref 0–40)
Albumin/Globulin Ratio: 1.5 (ref 1.2–2.2)
Albumin: 3.9 g/dL (ref 3.7–4.7)
Alkaline Phosphatase: 61 IU/L (ref 44–121)
BUN/Creatinine Ratio: 14 (ref 12–28)
BUN: 16 mg/dL (ref 8–27)
Bilirubin Total: 0.5 mg/dL (ref 0.0–1.2)
CO2: 27 mmol/L (ref 20–29)
Calcium: 9.4 mg/dL (ref 8.7–10.3)
Chloride: 104 mmol/L (ref 96–106)
Creatinine, Ser: 1.17 mg/dL — ABNORMAL HIGH (ref 0.57–1.00)
Globulin, Total: 2.6 g/dL (ref 1.5–4.5)
Glucose: 110 mg/dL — ABNORMAL HIGH (ref 70–99)
Potassium: 3.7 mmol/L (ref 3.5–5.2)
Sodium: 144 mmol/L (ref 134–144)
Total Protein: 6.5 g/dL (ref 6.0–8.5)
eGFR: 48 mL/min/{1.73_m2} — ABNORMAL LOW (ref >59)

## 2021-12-10 LAB — LIPID PANEL W/O CHOL/HDL RATIO
Cholesterol, Total: 131 mg/dL (ref 100–199)
HDL: 56 mg/dL (ref >39)
LDL Chol Calc (NIH): 60 mg/dL (ref 0–99)
Triglycerides: 72 mg/dL (ref 0–149)
VLDL Cholesterol Cal: 15 mg/dL (ref 5–40)

## 2021-12-10 LAB — VITAMIN B12: Vitamin B-12: 284 pg/mL (ref 232–1245)

## 2021-12-10 NOTE — Progress Notes (Signed)
Contacted via MyChart ? ? ?Good afternoon Denise Morris, your labs have returned.   ?- Kidney function continues to show some mild kidney disease, creatinine and eGFR, this is remaining stable, some mild decrease in eGFR.  Please ensure to get plenty of water in daily and add a little lemon to this + continue Olmesartan for kidney protection.   ?- Liver function, AST and ALT, is normal ?- Cholesterol levels look great, continue medication. ?-  B12 level is on low side of normal, this helps memory and nervous system health.  I recommend taking Vitamin B12 500 to 1000 MCG daily to help improve these levels.  Any questions? ?Keep being amazing!!  Thank you for allowing me to participate in your care.  I appreciate you. ?Kindest regards, ? ?

## 2022-01-04 DIAGNOSIS — H5203 Hypermetropia, bilateral: Secondary | ICD-10-CM | POA: Diagnosis not present

## 2022-01-04 DIAGNOSIS — H40019 Open angle with borderline findings, low risk, unspecified eye: Secondary | ICD-10-CM | POA: Diagnosis not present

## 2022-01-04 DIAGNOSIS — H524 Presbyopia: Secondary | ICD-10-CM | POA: Diagnosis not present

## 2022-01-04 DIAGNOSIS — H01024 Squamous blepharitis left upper eyelid: Secondary | ICD-10-CM | POA: Diagnosis not present

## 2022-01-04 DIAGNOSIS — H52223 Regular astigmatism, bilateral: Secondary | ICD-10-CM | POA: Diagnosis not present

## 2022-01-04 DIAGNOSIS — H43811 Vitreous degeneration, right eye: Secondary | ICD-10-CM | POA: Diagnosis not present

## 2022-01-04 DIAGNOSIS — H2513 Age-related nuclear cataract, bilateral: Secondary | ICD-10-CM | POA: Diagnosis not present

## 2022-01-04 LAB — HM DIABETES EYE EXAM

## 2022-02-17 ENCOUNTER — Other Ambulatory Visit: Payer: Self-pay | Admitting: Nurse Practitioner

## 2022-02-17 NOTE — Telephone Encounter (Signed)
Requested Prescriptions  Pending Prescriptions Disp Refills  . metoprolol succinate (TOPROL-XL) 50 MG 24 hr tablet [Pharmacy Med Name: METOPROLOL SUCC ER 50 MG TAB] 90 tablet 0    Sig: TAKE 1 TABLET BY MOUTH EVERY DAY WITH OR IMMEDIATELY FOLLOWING A MEAL     Cardiovascular:  Beta Blockers Passed - 02/17/2022  1:18 AM      Passed - Last BP in normal range    BP Readings from Last 1 Encounters:  12/09/21 103/63         Passed - Last Heart Rate in normal range    Pulse Readings from Last 1 Encounters:  12/09/21 (!) 29         Passed - Valid encounter within last 6 months    Recent Outpatient Visits          2 months ago Type 2 diabetes mellitus with obesity ([place])   [place], [place] T, NP   9 months ago Stage 3a chronic kidney disease ([place])   [place] Cannady, Barbaraann Faster, NP   1 year ago Primary hypertension   [facility] Maish [place], [place] T, NP   1 year ago Routine general medical examination at a health care facility   [place], [place] T, NP   2 years ago Essential hypertension   [place], Barbaraann Faster, NP      Future Appointments            In 3 months Cannady, Barbaraann Faster, NP [facility]

## 2022-03-02 DIAGNOSIS — I340 Nonrheumatic mitral (valve) insufficiency: Secondary | ICD-10-CM | POA: Diagnosis not present

## 2022-03-02 DIAGNOSIS — E669 Obesity, unspecified: Secondary | ICD-10-CM | POA: Diagnosis not present

## 2022-03-02 DIAGNOSIS — I10 Essential (primary) hypertension: Secondary | ICD-10-CM | POA: Diagnosis not present

## 2022-03-02 DIAGNOSIS — I351 Nonrheumatic aortic (valve) insufficiency: Secondary | ICD-10-CM | POA: Diagnosis not present

## 2022-03-02 DIAGNOSIS — E785 Hyperlipidemia, unspecified: Secondary | ICD-10-CM | POA: Diagnosis not present

## 2022-03-02 DIAGNOSIS — R002 Palpitations: Secondary | ICD-10-CM | POA: Diagnosis not present

## 2022-03-08 ENCOUNTER — Other Ambulatory Visit: Payer: Self-pay | Admitting: Nurse Practitioner

## 2022-03-09 NOTE — Telephone Encounter (Signed)
Refilled 02/17/2022 #90 [id number] refills. Requested Prescriptions  Pending Prescriptions Disp Refills  . metoprolol succinate (TOPROL-XL) 50 MG 24 hr tablet [Pharmacy Med Name: METOPROLOL SUCC ER 50 MG TAB] 90 tablet 0    Sig: TAKE 1 TABLET BY MOUTH EVERY DAY WITH OR IMMEDIATELY FOLLOWING A MEAL     Cardiovascular:  Beta Blockers Passed - 03/08/2022 10:54 AM      Passed - Last BP in normal range    BP Readings from Last 1 Encounters:  12/09/21 103/63         Passed - Last Heart Rate in normal range    Pulse Readings from Last 1 Encounters:  12/09/21 (!) 34         Passed - Valid encounter within last 6 months    Recent Outpatient Visits          3 months ago Type 2 diabetes mellitus with obesity ([place])   [place], [place] T, NP   9 months ago Stage 3a chronic kidney disease ([place])   [place] Cannady, Barbaraann Faster, NP   1 year ago Primary hypertension   [place], [place] T, NP   1 year ago Routine general medical examination at a health care facility   [place], [place] T, NP   2 years ago Essential hypertension   [place], Barbaraann Faster, NP      Future Appointments            In 3 months Cannady, Barbaraann Faster, NP [facility]

## 2022-05-07 ENCOUNTER — Other Ambulatory Visit: Payer: Self-pay | Admitting: Nurse Practitioner

## 2022-05-10 NOTE — Telephone Encounter (Signed)
Requested Prescriptions  Pending Prescriptions Disp Refills  . metoprolol succinate (TOPROL-XL) 50 MG 24 hr tablet [Pharmacy Med Name: METOPROLOL SUCC ER 50 MG TAB] 90 tablet 0    Sig: TAKE 1 TABLET BY MOUTH EVERY DAY WITH OR IMMEDIATELY FOLLOWING A MEAL     Cardiovascular:  Beta Blockers Passed - 05/07/2022  7:29 PM      Passed - Last BP in normal range    BP Readings from Last 1 Encounters:  12/09/21 103/63         Passed - Last Heart Rate in normal range    Pulse Readings from Last 1 Encounters:  12/09/21 (!) 30         Passed - Valid encounter within last 6 months    Recent Outpatient Visits          5 months ago Type 2 diabetes mellitus with obesity ([place])   [place], [place] T, NP   11 months ago Stage 3a chronic kidney disease ([place])   [place] Cannady, Barbaraann Faster, NP   1 year ago Primary hypertension   [facility] [place], [place] T, NP   2 years ago Routine general medical examination at a health care facility   [place], [place] T, NP   2 years ago Essential hypertension   [place], Barbaraann Faster, NP      Future Appointments            In 1 month Cannady, Barbaraann Faster, NP [facility]

## 2022-05-21 ENCOUNTER — Other Ambulatory Visit: Payer: Self-pay | Admitting: Nurse Practitioner

## 2022-05-24 DIAGNOSIS — Z23 Encounter for immunization: Secondary | ICD-10-CM | POA: Diagnosis not present

## 2022-05-24 NOTE — Telephone Encounter (Signed)
Refilled 05/19/2021 #90 4 rf. Requested Prescriptions  Pending Prescriptions Disp Refills  . chlorthalidone (HYGROTON) 25 MG tablet [Pharmacy Med Name: CHLORTHALIDONE 25 MG TABLET] 90 tablet 4    Sig: TAKE 1 TABLET (25 MG TOTAL) BY MOUTH DAILY.     Cardiovascular: Diuretics - Thiazide Failed - 05/21/2022 11:57 PM      Failed - Cr in normal range and within 180 days    Creatinine, Ser  Date Value Ref Range Status  12/09/2021 1.17 (H) 0.57 - 1.00 mg/dL Final         Passed - K in normal range and within 180 days    Potassium  Date Value Ref Range Status  12/09/2021 3.7 3.5 - 5.2 mmol/L Final         Passed - Na in normal range and within 180 days    Sodium  Date Value Ref Range Status  12/09/2021 144 134 - 144 mmol/L Final         Passed - Last BP in normal range    BP Readings from Last 1 Encounters:  12/09/21 103/63         Passed - Valid encounter within last 6 months    Recent Outpatient Visits          5 months ago Type 2 diabetes mellitus with obesity ([place])   [place], Henrine Screws T, NP   1 year ago Stage 3a chronic kidney disease ([place])   [place], Barbaraann Faster, NP   1 year ago Primary hypertension   [place], [place] T, NP   2 years ago Routine general medical examination at a health care facility   [place], [place] T, NP   2 years ago Essential hypertension   [place], Barbaraann Faster, NP      Future Appointments            In 2 weeks Cannady, Barbaraann Faster, NP [facility]

## 2022-06-03 ENCOUNTER — Other Ambulatory Visit: Payer: Self-pay | Admitting: Nurse Practitioner

## 2022-06-03 NOTE — Telephone Encounter (Signed)
Requested medication (s) are due for refill today - expired Rx  Requested medication (s) are on the active medication list -yes  Future visit scheduled -yes  Last refill: 05/19/21 #90 4RF  Notes to clinic: expired Rx  Requested Prescriptions  Pending Prescriptions Disp Refills   chlorthalidone (HYGROTON) 25 MG tablet [Pharmacy Med Name: CHLORTHALIDONE 25 MG TABLET] 90 tablet 4    Sig: Take 1 tablet (25 mg total) by mouth daily.     Cardiovascular: Diuretics - Thiazide Failed - 06/03/2022  2:33 PM      Failed - Cr in normal range and within 180 days    Creatinine, Ser  Date Value Ref Range Status  12/09/2021 1.17 (H) 0.57 - 1.00 mg/dL Final         Passed - K in normal range and within 180 days    Potassium  Date Value Ref Range Status  12/09/2021 3.7 3.5 - 5.2 mmol/L Final         Passed - Na in normal range and within 180 days    Sodium  Date Value Ref Range Status  12/09/2021 144 134 - 144 mmol/L Final         Passed - Last BP in normal range    BP Readings from Last 1 Encounters:  12/09/21 103/63         Passed - Valid encounter within last 6 months    Recent Outpatient Visits           5 months ago Type 2 diabetes mellitus with obesity ([place])   [place], Henrine Screws T, NP   1 year ago Stage 3a chronic kidney disease ([place])   [place], Barbaraann Faster, NP   1 year ago Primary hypertension   [place], [place] T, NP   2 years ago Routine general medical examination at a health care facility   [place], [place] T, NP   2 years ago Essential hypertension   [place], [place] T, NP       Future Appointments             In 1 week Cannady, Barbaraann Faster, NP [facility]               Requested Prescriptions  Pending Prescriptions Disp Refills   chlorthalidone (HYGROTON) 25 MG tablet [Pharmacy Med Name: CHLORTHALIDONE 25 MG TABLET] 90  tablet 4    Sig: Take 1 tablet (25 mg total) by mouth daily.     Cardiovascular: Diuretics - Thiazide Failed - 06/03/2022  2:33 PM      Failed - Cr in normal range and within 180 days    Creatinine, Ser  Date Value Ref Range Status  12/09/2021 1.17 (H) 0.57 - 1.00 mg/dL Final         Passed - K in normal range and within 180 days    Potassium  Date Value Ref Range Status  12/09/2021 3.7 3.5 - 5.2 mmol/L Final         Passed - Na in normal range and within 180 days    Sodium  Date Value Ref Range Status  12/09/2021 144 134 - 144 mmol/L Final         Passed - Last BP in normal range    BP Readings from Last 1 Encounters:  12/09/21 103/63         Passed - Valid encounter within last 6 months  Recent Outpatient Visits           5 months ago Type 2 diabetes mellitus with obesity ([place])   [place], Henrine Screws T, NP   1 year ago Stage 3a chronic kidney disease ([place])   [place] Cannady, Barbaraann Faster, NP   1 year ago Primary hypertension   [place], Henrine Screws T, NP   2 years ago Routine general medical examination at a health care facility   [place], Henrine Screws T, NP   2 years ago Essential hypertension   [place], Barbaraann Faster, NP       Future Appointments             In 1 week Cannady, Barbaraann Faster, NP [facility]

## 2022-06-07 ENCOUNTER — Encounter: Payer: Self-pay | Admitting: Nurse Practitioner

## 2022-06-10 ENCOUNTER — Encounter: Payer: Self-pay | Admitting: Nurse Practitioner

## 2022-06-10 ENCOUNTER — Ambulatory Visit (INDEPENDENT_AMBULATORY_CARE_PROVIDER_SITE_OTHER): Payer: Medicare Other | Admitting: Nurse Practitioner

## 2022-06-10 VITALS — BP 100/58 | HR 55 | Temp 98.4°F | Ht 67.0 in | Wt 206.4 lb

## 2022-06-10 DIAGNOSIS — R809 Proteinuria, unspecified: Secondary | ICD-10-CM

## 2022-06-10 DIAGNOSIS — E785 Hyperlipidemia, unspecified: Secondary | ICD-10-CM

## 2022-06-10 DIAGNOSIS — Z23 Encounter for immunization: Secondary | ICD-10-CM

## 2022-06-10 DIAGNOSIS — E6609 Other obesity due to excess calories: Secondary | ICD-10-CM

## 2022-06-10 DIAGNOSIS — Z6832 Body mass index (BMI) 32.0-32.9, adult: Secondary | ICD-10-CM

## 2022-06-10 DIAGNOSIS — I152 Hypertension secondary to endocrine disorders: Secondary | ICD-10-CM | POA: Diagnosis not present

## 2022-06-10 DIAGNOSIS — I700 Atherosclerosis of aorta: Secondary | ICD-10-CM | POA: Diagnosis not present

## 2022-06-10 DIAGNOSIS — E1159 Type 2 diabetes mellitus with other circulatory complications: Secondary | ICD-10-CM

## 2022-06-10 DIAGNOSIS — E669 Obesity, unspecified: Secondary | ICD-10-CM | POA: Diagnosis not present

## 2022-06-10 DIAGNOSIS — Z0000 Encounter for general adult medical examination without abnormal findings: Secondary | ICD-10-CM

## 2022-06-10 DIAGNOSIS — N1831 Chronic kidney disease, stage 3a: Secondary | ICD-10-CM | POA: Diagnosis not present

## 2022-06-10 DIAGNOSIS — E559 Vitamin D deficiency, unspecified: Secondary | ICD-10-CM | POA: Diagnosis not present

## 2022-06-10 DIAGNOSIS — E1169 Type 2 diabetes mellitus with other specified complication: Secondary | ICD-10-CM | POA: Diagnosis not present

## 2022-06-10 DIAGNOSIS — E1129 Type 2 diabetes mellitus with other diabetic kidney complication: Secondary | ICD-10-CM | POA: Diagnosis not present

## 2022-06-10 LAB — BAYER DCA HB A1C WAIVED: HB A1C (BAYER DCA - WAIVED): 6.5 % — ABNORMAL HIGH (ref 4.8–5.6)

## 2022-06-10 MED ORDER — METOPROLOL SUCCINATE ER 50 MG PO TB24: ORAL_TABLET | ORAL | 4 refills | Status: DC

## 2022-06-10 NOTE — Assessment & Plan Note (Signed)
Chronic, ongoing.  Diet-controlled.  A1c 6.5% today.  Urine ALB 13 December 2021.  Check CMP today.   Could consider [place] in future and stopping Chlorthalidone -- to allow diuretic affect maintained but extra kidney and diabetes protection. Consider nephrology referral if worsening CKD.

## 2022-06-10 NOTE — Assessment & Plan Note (Signed)
BMI 32.33.  Recommended eating smaller high protein, low fat meals more frequently and exercising 30 mins a day 5 times a week with a goal of 10-15lb weight loss in the next 3 months. Patient voiced their understanding and motivation to adhere to these recommendations.

## 2022-06-10 NOTE — Assessment & Plan Note (Signed)
Chronic, ongoing.  Diet-controlled.  A1c 6.5% today. Urine ALB 13 December 2021.  Check CMP today.   Could consider [place]ade in future and stopping Chlorthalidone -- to allow diuretic affect maintained but extra kidney and diabetes protection. Consider nephrology referral if worsening CKD.

## 2022-06-10 NOTE — Assessment & Plan Note (Deleted)
Chronic, ongoing.  Diet-controlled.   Urine ALB 13 December 2021.  Check CMP today.   Could consider [place] in future and stopping Chlorthalidone -- to allow diuretic affect maintained but extra kidney and diabetes protection. Consider nephrology referral if worsening CKD.

## 2022-06-10 NOTE — Assessment & Plan Note (Signed)
Chronic, ongoing.  Continue current medication regimen and adjust as needed. Lipid panel today. 

## 2022-06-10 NOTE — Progress Notes (Signed)
BP (!) 100/58   Pulse (!) 55   Temp 98.4 F (36.9 C) (Oral)   Ht '5\' 7"'  (1.702 m)   Wt 206 lb 6.4 oz (93.6 kg)   LMP  (LMP Unknown)   SpO2 98%   BMI 32.33 kg/m    Subjective:    Patient ID: Denise Morris, female    DOB: [birth year], 76 y.o.   MRN: [id number]  HPI: Denise Morris is a 76 y.o. female presenting on 06/10/2022 for comprehensive medical examination. Current medical complaints include:none  She currently lives with: self Menopausal Symptoms: no   DIABETES A1c initially elevated at 6.6% in August 2021.  Last A1c in April 2023 was 6.3%.  Diet controlled at this time. Hypoglycemic episodes:no Polydipsia/polyuria: no Visual disturbance: no Chest pain: no Paresthesias: no Glucose Monitoring: no  Accucheck frequency: Not Checking  Fasting glucose:  Post prandial:  Evening:  Before meals: Taking Insulin?: no  Long acting insulin:  Short acting insulin: Blood Pressure Monitoring: weekly Retinal Examination: Up to Date -- Dr. Riki Sheer Exam: Up to Date Pneumovax: Up to Date Influenza:  will obtain at pharmacy Aspirin: yes   HYPERTENSION / HYPERLIPIDEMIA Continues on Olmesartan 40 MG, Metoprolol XL 50 MG, Hygroton 25 MG, and ASA + Crestor for HLD.  Followed by Dr. Humphrey Rolls with cardiology, last saw about 4 months ago, scheduled next in November.  Last echo May 2020 == EF 60% noted in chart, no recent notes available.  Aortic atherosclerosis noted on imaging, CXR in 2019. Satisfied with current treatment? yes Duration of hypertension: chronic BP monitoring frequency: occasionally BP range: averages 120/70-80 average BP medication side effects: no Duration of hyperlipidemia: chronic Cholesterol medication side effects: no Cholesterol supplements: none Medication compliance: good compliance Aspirin: yes Recent stressors: no Recurrent headaches: no Visual changes: no Palpitations: occasionally -- followed by cardiology Dyspnea: no Chest pain: no Lower  extremity edema: no Dizzy/lightheaded: no   CHRONIC KIDNEY DISEASE CKD status: stable Medications renally dose: yes Previous renal evaluation: yes Pneumovax:  Up to Date Influenza Vaccine:  Up to Date    Depression Screen done today and results listed below:     06/10/2022   10:15 AM 12/09/2021   11:04 AM 09/21/2021   11:39 AM 10/14/2020   10:39 AM 04/08/2020    9:59 AM  Depression screen PHQ 2/9  Decreased Interest 0 0 0 0 0  Down, Depressed, Hopeless 0 0 0  0  PHQ - 2 Score 0 0 0 0 0  Altered sleeping 1 1     Tired, decreased energy 0 0     Change in appetite 0 0     Feeling bad or failure about yourself  0 0     Trouble concentrating 0 0     Moving slowly or fidgety/restless 0 0     Suicidal thoughts 0 0     PHQ-9 Score 1 1     Difficult doing work/chores Not difficult at all           06/10/2022   10:15 AM 12/09/2021   11:04 AM 09/21/2021   11:24 AM 05/19/2021    1:40 PM 10/14/2020   10:39 AM  Fall Risk   Falls in the past year? 0 0 0 0 0  Number falls in past yr: 0 0 0 0   Injury with Fall? 0 0 0 0 0  Risk for fall due to : No Fall Risks No Fall Risks  No Fall Risks  Follow up Falls evaluation completed Falls evaluation completed Falls evaluation completed;Falls prevention discussed Education provided     Functional Status Survey: Is the patient deaf or have difficulty hearing?: No Does the patient have difficulty seeing, even when wearing glasses/contacts?: No Does the patient have difficulty concentrating, remembering, or making decisions?: No Does the patient have difficulty walking or climbing stairs?: No Does the patient have difficulty dressing or bathing?: No Does the patient have difficulty doing errands alone such as visiting a doctor's office or shopping?: No    Past Medical History:  Past Medical History:  Diagnosis Date   Anxiety    Depression    Edema    Heart palpitations    Hyperlipidemia    Hypertension    Menopausal state    Osteopenia      Surgical History:  Past Surgical History:  Procedure Laterality Date   ABDOMINAL HYSTERECTOMY  1978   Partial ( Precancerous cells)    Medications:  Current Outpatient Medications on File Prior to Visit  Medication Sig   aspirin 81 MG tablet Take 81 mg by mouth daily.   chlorthalidone (HYGROTON) 25 MG tablet TAKE 1 TABLET (25 MG TOTAL) BY MOUTH DAILY.   Cholecalciferol 1.25 MG (50000 UT) TABS Take 1 tablet by mouth once a week.   hydrocortisone 2.5 % cream Apply topically 2 (two) times daily as needed.   nitroGLYCERIN (NITROSTAT) 0.3 MG SL tablet 1 tablet as needed.   olmesartan (BENICAR) 40 MG tablet Take 40 mg by mouth daily.   rosuvastatin (CRESTOR) 20 MG tablet Take 20 mg by mouth daily.   No current facility-administered medications on file prior to visit.    Allergies:  Allergies  Allergen Reactions   Citalopram Hydrobromide Nausea And Vomiting   Penicillins    Zestril [Lisinopril] Cough    Social History:  Social History   Socioeconomic History   Marital status: Widowed    Spouse name: Not on file   Number of children: Not on file   Years of education: Not on file   Highest education level: Master's degree (e.g., MA, MS, MEng, MEd, MSW, MBA)  Occupational History   Occupation: retired  Tobacco Use   Smoking status: Never   Smokeless tobacco: Never  Vaping Use   Vaping Use: Never used  Substance and Sexual Activity   Alcohol use: No   Drug use: No   Sexual activity: Yes  Other Topics Concern   Not on file  Social History Narrative   [employer]- meet monthly   [employer]- meet monthly   Social Determinants of Health   Financial Resource Strain: Low Risk  (09/21/2021)   Overall Financial Resource Strain (CARDIA)    Difficulty of Paying Living Expenses: Not hard at all  Food Insecurity: No Food Insecurity (09/21/2021)   Hunger Vital Sign    Worried About Running Out of Food in the Last Year: Never true    [place] in the Last Year: Never true   Transportation Needs: No Transportation Needs (09/21/2021)   PRAPARE - Hydrologist (Medical): No    Lack of Transportation (Non-Medical): No  Physical Activity: Unknown (09/21/2021)   Exercise Vital Sign    Days of Exercise per Week: Not on file    Minutes of Exercise per Session: 0 min  Recent Concern: Physical Activity - Inactive (09/21/2021)   Exercise Vital Sign    Days of Exercise per Week: 3 days    Minutes of Exercise  per Session: 0 min  Stress: No Stress Concern Present (09/21/2021)   [place]    Feeling of Stress : Not at all  Social Connections: Moderately Integrated (09/21/2021)   Social Connection and Isolation Panel [NHANES]    Frequency of Communication with Friends and Family: More than three times a week    Frequency of Social Gatherings with Friends and Family: More than three times a week    Attends Religious Services: More than 4 times per year    Active Member of [employer] or Organizations: Yes    Attends Archivist Meetings: Not on file    Marital Status: Widowed  Intimate Partner Violence: Unknown (09/21/2021)   Humiliation, Afraid, Rape, and Kick questionnaire    Fear of Current or Ex-Partner: Not on file    Emotionally Abused: No    Physically Abused: No    Sexually Abused: No   Social History   Tobacco Use  Smoking Status Never  Smokeless Tobacco Never   Social History   Substance and Sexual Activity  Alcohol Use No    Family History:  Family History  Problem Relation Age of Onset   Alzheimer's disease Mother    Hypertension Father    Hypertension Sister    Hyperlipidemia Sister    Breast cancer Neg Hx     Past medical history, surgical history, medications, allergies, family history and social history reviewed with patient today and changes made to appropriate areas of the chart.   Review of Systems - negative All other ROS negative except what  is listed above and in the HPI.      Objective:    BP (!) 100/58   Pulse (!) 55   Temp 98.4 F (36.9 C) (Oral)   Ht '5\' 7"'  (1.702 m)   Wt 206 lb 6.4 oz (93.6 kg)   LMP  (LMP Unknown)   SpO2 98%   BMI 32.33 kg/m   Wt Readings from Last 3 Encounters:  06/10/22 206 lb 6.4 oz (93.6 kg)  12/09/21 204 lb 9.6 oz (92.8 kg)  05/19/21 201 lb 4 oz (91.3 kg)    Physical Exam Vitals and nursing note reviewed.  Constitutional:      General: She is awake. She is not in acute distress.    Appearance: She is well-developed. She is not ill-appearing.  HENT:     Head: Normocephalic and atraumatic.     Right Ear: Hearing, tympanic membrane, ear canal and external ear normal. No drainage.     Left Ear: Hearing, tympanic membrane, ear canal and external ear normal. No drainage.     Nose: Nose normal.     Right Sinus: No maxillary sinus tenderness or frontal sinus tenderness.     Left Sinus: No maxillary sinus tenderness or frontal sinus tenderness.     Mouth/Throat:     Mouth: Mucous membranes are moist.     Pharynx: Oropharynx is clear. Uvula midline. No pharyngeal swelling, oropharyngeal exudate or posterior oropharyngeal erythema.  Eyes:     General: Lids are normal.        Right eye: No discharge.        Left eye: No discharge.     Extraocular Movements: Extraocular movements intact.     Conjunctiva/sclera: Conjunctivae normal.     Pupils: Pupils are equal, round, and reactive to light.     Visual Fields: Right eye visual fields normal and left eye visual fields normal.  Neck:  Thyroid: No thyromegaly.     Vascular: No carotid bruit.     Trachea: Trachea normal.  Cardiovascular:     Rate and Rhythm: Normal rate and regular rhythm.     Heart sounds: Normal heart sounds. No murmur heard.    No gallop.  Pulmonary:     Effort: Pulmonary effort is normal. No accessory muscle usage or respiratory distress.     Breath sounds: Normal breath sounds.  Chest:  Breasts:    Right: Normal.      Left: Normal.  Abdominal:     General: Bowel sounds are normal.     Palpations: Abdomen is soft. There is no hepatomegaly or splenomegaly.     Tenderness: There is no abdominal tenderness.  Musculoskeletal:        General: Normal range of motion.     Cervical back: Normal range of motion and neck supple.     Right lower leg: No edema.     Left lower leg: No edema.  Lymphadenopathy:     Head:     Right side of head: No submental, submandibular, tonsillar, preauricular or posterior auricular adenopathy.     Left side of head: No submental, submandibular, tonsillar, preauricular or posterior auricular adenopathy.     Cervical: No cervical adenopathy.     Upper Body:     Right upper body: No supraclavicular, axillary or pectoral adenopathy.     Left upper body: No supraclavicular, axillary or pectoral adenopathy.  Skin:    General: Skin is warm and dry.     Capillary Refill: Capillary refill takes less than 2 seconds.     Findings: No rash.  Neurological:     Mental Status: She is alert and oriented to person, place, and time.     Gait: Gait is intact.     Deep Tendon Reflexes: Reflexes are normal and symmetric.     Reflex Scores:      Brachioradialis reflexes are 2+ on the right side and 2+ on the left side.      Patellar reflexes are 2+ on the right side and 2+ on the left side. Psychiatric:        Attention and Perception: Attention normal.        Mood and Affect: Mood normal.        Speech: Speech normal.        Behavior: Behavior normal. Behavior is cooperative.        Thought Content: Thought content normal.        Judgment: Judgment normal.    Results for orders placed or performed in visit on 12/09/21  Bayer DCA Hb A1c Waived  Result Value Ref Range   HB A1C (BAYER DCA - WAIVED) 6.3 (H) 4.8 - 5.6 %  Microalbumin, Urine Waived  Result Value Ref Range   Microalb, Ur Waived 30 (H) 0 - 19 mg/L   Creatinine, Urine Waived 200 10 - 300 mg/dL   Microalb/Creat Ratio <30  <30 mg/g  Comprehensive metabolic panel  Result Value Ref Range   Glucose 110 (H) 70 - 99 mg/dL   BUN 16 8 - 27 mg/dL   Creatinine, Ser 1.17 (H) 0.57 - 1.00 mg/dL   eGFR 48 (L) >59 mL/min/1.73   BUN/Creatinine Ratio 14 12 - 28   Sodium 144 134 - 144 mmol/L   Potassium 3.7 3.5 - 5.2 mmol/L   Chloride 104 96 - 106 mmol/L   CO2 27 20 - 29 mmol/L   Calcium 9.4 8.7 -  10.3 mg/dL   Total Protein 6.5 6.0 - 8.5 g/dL   Albumin 3.9 3.7 - 4.7 g/dL   Globulin, Total 2.6 1.5 - 4.5 g/dL   Albumin/Globulin Ratio 1.5 1.2 - 2.2   Bilirubin Total 0.5 0.0 - 1.2 mg/dL   Alkaline Phosphatase 61 44 - 121 IU/L   AST 21 0 - 40 IU/L   ALT 13 0 - 32 IU/L  Lipid Panel w/o Chol/HDL Ratio  Result Value Ref Range   Cholesterol, Total 131 100 - 199 mg/dL   Triglycerides 72 0 - 149 mg/dL   HDL 56 >39 mg/dL   VLDL Cholesterol Cal 15 5 - 40 mg/dL   LDL Chol Calc (NIH) 60 0 - 99 mg/dL  Vitamin B12  Result Value Ref Range   Vitamin B-12 284 232 - 1,245 pg/mL      Assessment & Plan:   Problem List Items Addressed This Visit       Cardiovascular and Mediastinum   Aortic atherosclerosis (HCC)    Chronic.  Noted on imaging 11/21/2017.  Educated patient on this finding.  Recommend continue statin and ASA daily for prevention.      Relevant Medications   metoprolol succinate (TOPROL-XL) 50 MG 24 hr tablet   Other Relevant Orders   Comprehensive metabolic panel   Lipid Panel w/o Chol/HDL Ratio   Hypertension associated with diabetes (HCC)    Chronic, stable.  BP well below goal.  Continue current medication regimen and collaboration with cardiology.  Continue to monitor BP at home regularly and focus on DASH diet.  LABS: CBC, CMP, TSH.  Refills sent in.  Could consider Wilder Glade in future and stopping Chlorthalidone -- to allow diuretic affect maintained but extra kidney and diabetes protection.  Return in 6 months for follow-up.      Relevant Medications   metoprolol succinate (TOPROL-XL) 50 MG 24 hr tablet    Other Relevant Orders   Bayer DCA Hb A1c Waived   CBC with Differential/Platelet   Comprehensive metabolic panel   TSH     Endocrine   Hyperlipidemia associated with type 2 diabetes mellitus (HCC)    Chronic, ongoing.  Continue current medication regimen and adjust as needed.  Lipid panel today.      Relevant Medications   metoprolol succinate (TOPROL-XL) 50 MG 24 hr tablet   Other Relevant Orders   Bayer DCA Hb A1c Waived   Comprehensive metabolic panel   Lipid Panel w/o Chol/HDL Ratio   Type 2 diabetes mellitus with obesity (HCC) - Primary    Chronic, ongoing.  Diet-controlled.  A1c 6.5% today. Urine ALB 13 December 2021.  Check CMP today.   Could consider Wilder Glade in future and stopping Chlorthalidone -- to allow diuretic affect maintained but extra kidney and diabetes protection. Consider nephrology referral if worsening CKD.      Relevant Orders   Bayer DCA Hb A1c Waived   Comprehensive metabolic panel   Type 2 diabetes mellitus with proteinuria (HCC)    Chronic, ongoing.  Diet-controlled.  A1c 6.5% today.  Urine ALB 13 December 2021.  Check CMP today.   Could consider Wilder Glade in future and stopping Chlorthalidone -- to allow diuretic affect maintained but extra kidney and diabetes protection. Consider nephrology referral if worsening CKD.      Relevant Orders   Bayer DCA Hb A1c Waived   Comprehensive metabolic panel     Genitourinary   CKD (chronic kidney disease) stage 3, GFR 30-59 ml/min (HCC)    Ongoing.  Educated patient on findings and findings with her HTN.  Continue Olmesartan for kidney protection with her proteinuria and renal dose medications as needed.  Avoid NSAIDs and contrast unless needed.  CMP today.  Could consider Wilder Glade in future and stopping Chlorthalidone -- to allow diuretic affect maintained but extra kidney and diabetes protection CKD.      Relevant Orders   CBC with Differential/Platelet   Comprehensive metabolic panel     Other   Obesity    BMI  32.33.  Recommended eating smaller high protein, low fat meals more frequently and exercising 30 mins a day 5 times a week with a goal of 10-15lb weight loss in the next 3 months. Patient voiced their understanding and motivation to adhere to these recommendations.       Vitamin D deficiency    With CKD, recheck level today and continue supplement.      Relevant Orders   VITAMIN D 25 Hydroxy (Vit-D Deficiency, Fractures)   Other Visit Diagnoses     Healthy adult on routine physical examination       Reviewed health maintenance with patient and overall exam.        Follow up plan: Return in about 6 months (around 12/10/2022) for T2DM, HTN/HLD, CKD.   LABORATORY TESTING:  - Pap smear: not applicable  IMMUNIZATIONS:   - Tetanus vaccination status reviewed: will wait until next visit - Influenza: Up to date - Pneumovax: Up to date - Prevnar: Up to date - HPV: Not applicable - Zostavax vaccine: Up to date - COVID -- Up To Date  SCREENING: -Mammogram: Up to date  - Colonoscopy: age 7 -- would like to not continue - Bone Density: Up to date  -Hearing Test: Not applicable  -Spirometry: Not applicable   PATIENT COUNSELING:   Advised to take 1 mg of folate supplement per day if capable of pregnancy.   Sexuality: Discussed sexually transmitted diseases, partner selection, use of condoms, avoidance of unintended pregnancy  and contraceptive alternatives.   Advised to avoid cigarette smoking.  I discussed with the patient that most people either abstain from alcohol or drink within safe limits (<=14/week and <=4 drinks/occasion for males, <=7/weeks and <= 3 drinks/occasion for females) and that the risk for alcohol disorders and other health effects rises proportionally with the number of drinks per week and how often a drinker exceeds daily limits.  Discussed cessation/primary prevention of drug use and availability of treatment for abuse.   Diet: Encouraged to adjust caloric  intake to maintain  or achieve ideal body weight, to reduce intake of dietary saturated fat and total fat, to limit sodium intake by avoiding high sodium foods and not adding table salt, and to maintain adequate dietary potassium and calcium preferably from fresh fruits, vegetables, and low-fat dairy products.    Stressed the importance of regular exercise  Injury prevention: Discussed safety belts, safety helmets, smoke detector, smoking near bedding or upholstery.   Dental health: Discussed importance of regular tooth brushing, flossing, and dental visits.    NEXT PREVENTATIVE PHYSICAL DUE IN 1 YEAR. Return in about 6 months (around 12/10/2022) for T2DM, HTN/HLD, CKD.

## 2022-06-10 NOTE — Patient Instructions (Signed)

## 2022-06-10 NOTE — Assessment & Plan Note (Signed)
With CKD, recheck level today and continue supplement.

## 2022-06-10 NOTE — Assessment & Plan Note (Signed)
Chronic.  Noted on imaging 11/21/2017.  Educated patient on this finding.  Recommend continue statin and ASA daily for prevention.

## 2022-06-10 NOTE — Assessment & Plan Note (Signed)
Chronic, stable.  BP well below goal.  Continue current medication regimen and collaboration with cardiology.  Continue to monitor BP at home regularly and focus on DASH diet.  LABS: CBC, CMP, TSH.  Refills sent in.  Could consider Wilder Glade in future and stopping Chlorthalidone -- to allow diuretic affect maintained but extra kidney and diabetes protection.  Return in 6 months for follow-up.

## 2022-06-10 NOTE — Assessment & Plan Note (Signed)
Ongoing.  Educated patient on findings and findings with her HTN.  Continue Olmesartan for kidney protection with her proteinuria and renal dose medications as needed.  Avoid NSAIDs and contrast unless needed.  CMP today.  Could consider Wilder Glade in future and stopping Chlorthalidone -- to allow diuretic affect maintained but extra kidney and diabetes protection CKD.

## 2022-06-11 LAB — COMPREHENSIVE METABOLIC PANEL
ALT: 17 IU/L (ref 0–32)
AST: 20 IU/L (ref 0–40)
Albumin/Globulin Ratio: 1.5 (ref 1.2–2.2)
Albumin: 4.1 g/dL (ref 3.8–4.8)
Alkaline Phosphatase: 65 IU/L (ref 44–121)
BUN/Creatinine Ratio: 14 (ref 12–28)
BUN: 17 mg/dL (ref 8–27)
Bilirubin Total: 0.3 mg/dL (ref 0.0–1.2)
CO2: 25 mmol/L (ref 20–29)
Calcium: 8.9 mg/dL (ref 8.7–10.3)
Chloride: 102 mmol/L (ref 96–106)
Creatinine, Ser: 1.21 mg/dL — ABNORMAL HIGH (ref 0.57–1.00)
Globulin, Total: 2.7 g/dL (ref 1.5–4.5)
Glucose: 119 mg/dL — ABNORMAL HIGH (ref 70–99)
Potassium: 3.9 mmol/L (ref 3.5–5.2)
Sodium: 140 mmol/L (ref 134–144)
Total Protein: 6.8 g/dL (ref 6.0–8.5)
eGFR: 46 mL/min/{1.73_m2} — ABNORMAL LOW (ref >59)

## 2022-06-11 LAB — LIPID PANEL W/O CHOL/HDL RATIO
Cholesterol, Total: 124 mg/dL (ref 100–199)
HDL: 54 mg/dL (ref >39)
LDL Chol Calc (NIH): 56 mg/dL (ref 0–99)
Triglycerides: 67 mg/dL (ref 0–149)
VLDL Cholesterol Cal: 14 mg/dL (ref 5–40)

## 2022-06-11 LAB — CBC WITH DIFFERENTIAL/PLATELET
Basophils Absolute: 0.1 10*3/uL (ref 0.0–0.2)
Basos: 1 %
EOS (ABSOLUTE): 0.3 10*3/uL (ref 0.0–0.4)
Eos: 7 %
Hematocrit: 41.8 % (ref 34.0–46.6)
Hemoglobin: 13.5 g/dL (ref 11.1–15.9)
Immature Grans (Abs): 0 10*3/uL (ref 0.0–0.1)
Immature Granulocytes: 0 %
Lymphocytes Absolute: 2.2 10*3/uL (ref 0.7–3.1)
Lymphs: 43 %
MCH: 29.6 pg (ref 26.6–33.0)
MCHC: 32.3 g/dL (ref 31.5–35.7)
MCV: 92 fL (ref 79–97)
Monocytes Absolute: 0.4 10*3/uL (ref 0.1–0.9)
Monocytes: 8 %
Neutrophils Absolute: 2 10*3/uL (ref 1.4–7.0)
Neutrophils: 41 %
Platelets: 168 10*3/uL (ref 150–450)
RBC: 4.56 x10E6/uL (ref 3.77–5.28)
RDW: 12.8 % (ref 11.7–15.4)
WBC: 5 10*3/uL (ref 3.4–10.8)

## 2022-06-11 LAB — TSH: TSH: 1.29 u[IU]/mL (ref 0.450–4.500)

## 2022-06-11 LAB — VITAMIN D 25 HYDROXY (VIT D DEFICIENCY, FRACTURES): Vit D, 25-Hydroxy: 104 ng/mL — ABNORMAL HIGH (ref 30.0–100.0)

## 2022-06-11 NOTE — Progress Notes (Signed)
Contacted via [place] morning Denise Morris, your labs have returned: - Kidney function, creatinine and eGFR, continues to show mild kidney disease that is stable.  Liver function, AST and ALT, is normal. - Vitamin D now a little too high, I recommend stop weekly dosing and start over the counter Vitamin D3 2000 units a few days a week.  We may increase to daily in future. - CBC shows no anemia or infection. - Cholesterol levels are at goal and thyroid normal.  Keep up the great work.  Any questions? Keep being amazing!!  Thank you for allowing me to participate in your care.  I appreciate you. Kindest regards, Lanea Vankirk

## 2022-07-05 DIAGNOSIS — I10 Essential (primary) hypertension: Secondary | ICD-10-CM | POA: Diagnosis not present

## 2022-07-05 DIAGNOSIS — R002 Palpitations: Secondary | ICD-10-CM | POA: Diagnosis not present

## 2022-07-05 DIAGNOSIS — I351 Nonrheumatic aortic (valve) insufficiency: Secondary | ICD-10-CM | POA: Diagnosis not present

## 2022-07-05 DIAGNOSIS — E785 Hyperlipidemia, unspecified: Secondary | ICD-10-CM | POA: Diagnosis not present

## 2022-07-05 DIAGNOSIS — R0602 Shortness of breath: Secondary | ICD-10-CM | POA: Diagnosis not present

## 2022-07-05 DIAGNOSIS — I340 Nonrheumatic mitral (valve) insufficiency: Secondary | ICD-10-CM | POA: Diagnosis not present

## 2022-09-06 ENCOUNTER — Other Ambulatory Visit: Payer: Self-pay | Admitting: Nurse Practitioner

## 2022-09-06 DIAGNOSIS — Z1231 Encounter for screening mammogram for malignant neoplasm of breast: Secondary | ICD-10-CM

## 2022-09-28 ENCOUNTER — Ambulatory Visit
Admission: RE | Admit: 2022-09-28 | Discharge: 2022-09-28 | Disposition: A | Discharge status: Home / Self Care | Payer: Medicare Other | Source: Ambulatory Visit | Attending: Nurse Practitioner | Admitting: Nurse Practitioner

## 2022-09-28 DIAGNOSIS — Z1231 Encounter for screening mammogram for malignant neoplasm of breast: Secondary | ICD-10-CM | POA: Diagnosis not present

## 2022-09-30 NOTE — Progress Notes (Signed)
Contacted via MyChart   Normal mammogram, may repeat in one year:)

## 2022-11-04 ENCOUNTER — Ambulatory Visit: Payer: Medicare Other | Admitting: Cardiovascular Disease

## 2022-11-09 ENCOUNTER — Telehealth: Payer: Self-pay | Admitting: Nurse Practitioner

## 2022-11-09 NOTE — Telephone Encounter (Signed)
Contacted Collyn B Currier to schedule their annual wellness visit. Appointment made for 034/22/2024.  Sherol Dade; Care Guide [facility] Direct Dial: [phone]

## 2022-11-29 ENCOUNTER — Encounter: Payer: Self-pay | Admitting: Cardiovascular Disease

## 2022-11-29 ENCOUNTER — Ambulatory Visit (INDEPENDENT_AMBULATORY_CARE_PROVIDER_SITE_OTHER): Payer: Medicare Other | Admitting: Cardiovascular Disease

## 2022-11-29 VITALS — BP 110/68 | HR 82 | Ht 66.0 in | Wt 207.2 lb

## 2022-11-29 DIAGNOSIS — I152 Hypertension secondary to endocrine disorders: Secondary | ICD-10-CM

## 2022-11-29 DIAGNOSIS — E1159 Type 2 diabetes mellitus with other circulatory complications: Secondary | ICD-10-CM | POA: Diagnosis not present

## 2022-11-29 DIAGNOSIS — E785 Hyperlipidemia, unspecified: Secondary | ICD-10-CM | POA: Diagnosis not present

## 2022-11-29 DIAGNOSIS — Q245 Malformation of coronary vessels: Secondary | ICD-10-CM

## 2022-11-29 DIAGNOSIS — I700 Atherosclerosis of aorta: Secondary | ICD-10-CM

## 2022-11-29 DIAGNOSIS — E1169 Type 2 diabetes mellitus with other specified complication: Secondary | ICD-10-CM | POA: Diagnosis not present

## 2022-11-29 NOTE — Progress Notes (Signed)
Cardiology Office Note   Date:  11/29/2022   ID:  Denise Morris, DOB [birth year], MRN [id number]  PCP:  Marjie Skiff, NP  Cardiologist:  Adrian Blackwater, MD      History of Present Illness: Denise Morris is a 77 y.o. female who presents for  Chief Complaint  Patient presents with   Follow-up    4 month follow up    Has occasional palpitation resolved with extra dose of metoprolol.  Palpitations  This is a recurrent problem. The current episode started more than 1 year ago.      Past Medical History:  Diagnosis Date   Anxiety    Depression    Edema    Heart palpitations    Hyperlipidemia    Hypertension    Menopausal state    Osteopenia      Past Surgical History:  Procedure Laterality Date   ABDOMINAL HYSTERECTOMY  1978   Partial ( Precancerous cells)     Current Outpatient Medications  Medication Sig Dispense Refill   aspirin 81 MG tablet Take 81 mg by mouth daily.     chlorthalidone (HYGROTON) 25 MG tablet TAKE 1 TABLET (25 MG TOTAL) BY MOUTH DAILY. 90 tablet 4   cholecalciferol (VITAMIN D3) 25 MCG (1000 UNIT) tablet Take 1,000 Units by mouth daily.     hydrocortisone 2.5 % cream Apply topically 2 (two) times daily as needed. 30 g 2   metoprolol succinate (TOPROL-XL) 50 MG 24 hr tablet TAKE 1 TABLET BY MOUTH EVERY DAY WITH OR IMMEDIATELY FOLLOWING A MEAL 90 tablet 4   nitroGLYCERIN (NITROSTAT) 0.3 MG SL tablet 1 tablet as needed.     olmesartan (BENICAR) 40 MG tablet Take 40 mg by mouth daily.     rosuvastatin (CRESTOR) 20 MG tablet Take 20 mg by mouth daily.     No current facility-administered medications for this visit.    Allergies:   Citalopram hydrobromide, Penicillins, and Zestril [lisinopril]    Social History:   reports that she has never smoked. She has never used smokeless tobacco. She reports that she does not drink alcohol and does not use drugs.   Family History:  family history includes Alzheimer's disease in her mother;  Hyperlipidemia in her sister; Hypertension in her father and sister.    ROS:     Review of Systems  Constitutional: Negative.   HENT: Negative.    Eyes: Negative.   Respiratory: Negative.    Cardiovascular:  Positive for palpitations.  Gastrointestinal: Negative.   Genitourinary: Negative.   Musculoskeletal: Negative.   Skin: Negative.   Neurological: Negative.   Endo/Heme/Allergies: Negative.   Psychiatric/Behavioral: Negative.    All other systems reviewed and are negative.     All other systems are reviewed and negative.    PHYSICAL EXAM: VS:  BP 110/68   Pulse 82   Ht 5\' 6"  (1.676 m)   Wt 207 lb 3.2 oz (94 kg)   LMP  (LMP Unknown)   SpO2 95%   BMI 33.44 kg/m  , BMI Body mass index is 33.44 kg/m. Last weight:  Wt Readings from Last 3 Encounters:  11/29/22 207 lb 3.2 oz (94 kg)  06/10/22 206 lb 6.4 oz (93.6 kg)  12/09/21 204 lb 9.6 oz (92.8 kg)     Physical Exam Constitutional:      Appearance: Normal appearance.  Cardiovascular:     Rate and Rhythm: Normal rate and regular rhythm.     Heart sounds: Normal  heart sounds.  Pulmonary:     Effort: Pulmonary effort is normal.     Breath sounds: Normal breath sounds.  Musculoskeletal:     Right lower leg: No edema.     Left lower leg: No edema.  Neurological:     Mental Status: She is alert.       EKG:   Recent Labs: 06/10/2022: ALT 17; BUN 17; Creatinine, Ser 1.21; Hemoglobin 13.5; Platelets 168; Potassium 3.9; Sodium 140; TSH 1.290    Lipid Panel    Component Value Date/Time   CHOL 124 06/10/2022 1017   CHOL 148 02/12/2015 1022   TRIG 67 06/10/2022 1017   TRIG 80 02/12/2015 1022   HDL 54 06/10/2022 1017   CHOLHDL 3.7 05/31/2018 1353   VLDL 16 02/12/2015 1022   LDLCALC 56 06/10/2022 1017      Other studies Reviewed: Additional studies/ records that were reviewed today include:  Review of the above records demonstrates:       No data to display            ASSESSMENT AND  PLAN:    ICD-10-CM   1. 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 Aortic atherosclerosis  I70.0     3. Hypertension associated with diabetes  E11.59    I15.2     4. Hyperlipidemia associated with type 2 diabetes mellitus  E11.69    E78.5        Problem List Items Addressed This Visit       Cardiovascular and Mediastinum   Hypertension associated with diabetes   Anomalous coronary artery origin - Primary    Has Left main originating from RCA but not in between pulmonary trunk and aortic arch.      Aortic atherosclerosis     Endocrine   Hyperlipidemia associated with type 2 diabetes mellitus       Disposition:   Return in about 3 months (around 02/28/2023).    Total time spent: 30 minutes  Signed,  Adrian Blackwater, MD  11/29/2022 11:39 AM    [facility]

## 2022-11-29 NOTE — Assessment & Plan Note (Signed)
Has Left main originating from RCA but not in between pulmonary trunk and aortic arch.

## 2022-12-05 NOTE — Patient Instructions (Signed)
Be Involved in Your Health Care:  Taking Medications When medications are taken as directed, they can greatly improve your health. But if they are not taken as instructed, they may not work. In some cases, not taking them correctly can be harmful. To help ensure your treatment remains effective and safe, understand your medications and how to take them.  Your lab results, notes and after visit summary will be available on My Chart. We strongly encourage you to use this feature. If lab results are abnormal the clinic will contact you with the appropriate steps. If the clinic does not contact you assume the results are satisfactory. You can always see your results on My Chart. If you have questions regarding your condition, please contact the clinic during office hours. You can also ask questions on My Chart.  We at [facility] are grateful that you chose us to provide care. We strive to provide excellent and compassionate care and are always looking for feedback. If you get a survey from the clinic please complete this.   Food Basics for Chronic Kidney Disease Chronic kidney disease (CKD) is when your kidneys are not working well. They cannot remove waste, fluids, and other substances from your blood the way they should. These substances can build up, which can worsen kidney damage and affect how your body works. Eating certain foods can lead to a buildup of these substances. Changing your diet can help prevent more kidney damage. Diet changes may also delay dialysis or even keep you from needing it. What nutrients should I limit? Work with your treatment team and a food expert (dietitian) to make a meal plan that's right for you. Foods you can eat and foods you should limit or avoid will depend on the stage of your kidney disease and any other health conditions you have. The items listed below are not a complete list. Talk with your dietitian to learn what is best for  you. Potassium Potassium affects how steadily your heart beats. Too much potassium in your blood can cause an irregular heartbeat or even a heart attack. You may need to limit foods that are high in potassium, such as: Liquid milk and soy milk. Salt substitutes that contain potassium. Fruits like bananas, apricots, nectarines, melon, prunes, raisins, kiwi, and oranges. Vegetables, such as potatoes, sweet potatoes, yams, tomatoes, leafy greens, beets, avocado, pumpkin, and winter squash. Beans, like lima beans. Nuts. Phosphorus Phosphorus is a mineral found in your bones. You need a balance between calcium and phosphorus to build and maintain healthy bones. Too much added phosphorus from the foods you eat can pull calcium from your bones. Losing calcium can make your bones weak and more likely to break. Too much phosphorus can also make your skin itch. You may need to limit foods that are high in phosphorus or that have added phosphorus, such as: Liquid milk and dairy products. Dark-colored sodas or soft drinks. Bran cereals and oatmeal. Protein  Protein helps you make and keep muscle. Protein also helps to repair your body's cells and tissues. One of the natural breakdown products of protein is a waste product called urea. When your kidneys are not working well, they cannot remove types of waste like urea. Reducing protein in your diet can help keep urea from building up in your blood. Depending on your stage of kidney disease, you may need to eat smaller portions of foods that are high in protein. Sources of animal protein include: Meat (all types). Fish and   seafood. Poultry. Eggs. Dairy. Other protein foods include: Beans and legumes. Nuts and nut butter. Soy, like tofu.  Sodium Salt (sodium) helps to keep a healthy balance of fluids in your body. Too much salt can increase your blood pressure, which can harm your heart and lungs. Extra salt can also cause your body to keep too much  fluid, making your kidneys work harder. You may need to limit or avoid foods that are high in salt, such as: Salt seasonings. Soy and teriyaki sauce. Packaged, precooked, cured, or processed meats, such as sausages or meat loaves. Sardines. Salted crackers and snack foods. Fast food. Canned soups and most canned foods. Pickled foods. Vegetable juice. Boxed mixes or ready-to-eat boxed meals and side dishes. Bottled dressings, sauces, and marinades. Talk with your dietitian about how much potassium, phosphorus, protein, and salt you may have each day. Helpful tips Read food labels  Check the amount of salt in foods. Limit foods that have salt or sodium listed among the first five ingredients. Try to eat low-salt foods. Check the ingredient list for added phosphorus or potassium. "Phos" in an ingredient is a sign that phosphorus has been added. Do not buy foods that are calcium-enriched or that have calcium added to them (are fortified). Buy canned vegetables and beans that say "no salt added" and rinse them before eating. Lifestyle Limit the amount of protein you eat from animal sources each day. Focus on protein from plant sources, like tofu and dried beans, peas, and lentils. Do not add salt to food when cooking or before eating. Do not eat star fruit. It can be toxic for people with kidney problems. Talk with your health care provider before taking any vitamin or mineral supplements. If told by your health care provider, track how much liquid you drink so you can avoid drinking too much. You may need to include foods you eat that are made mostly from water, like gelatin, ice cream, soups, and juicy fruits and vegetables. If you have diabetes: If you have diabetes (diabetes mellitus) and CKD, you need to keep your blood sugar (glucose) in the target range recommended by your health care provider. Follow your diabetes management plan. This may include: Checking your blood glucose  regularly. Taking medicines by mouth, or taking insulin, or both. Exercising for at least 30 minutes on 5 or more days each week, or as told by your health care provider. Tracking how many servings of carbohydrates you eat at each meal. Not using orange juice to treat low blood sugars. Instead, use apple juice, cranberry juice, or clear soda. You may be given guidelines on what foods and nutrients you may eat, and how much you can have each day. This depends on your stage of kidney disease and whether you have high blood pressure (hypertension). Follow the meal plan your dietitian gives you. To learn more: National Institute of Diabetes and Digestive and Kidney Diseases: niddk.nih.gov National Kidney Foundation: kidney.org Summary Chronic kidney disease (CKD) is when your kidneys are not working well. They cannot remove waste, fluids, and other substances from your blood the way they should. These substances can build up, which can worsen kidney damage and affect how your body works. Changing your diet can help prevent more kidney damage. Diet changes may also delay dialysis or even keep you from needing it. Diet changes are different for each person with CKD. Work with a dietitian to set up a meal plan that is right for you. This information is not intended   to replace advice given to you by your health care provider. Make sure you discuss any questions you have with your health care provider. Document Revised: 11/20/2021 Document Reviewed: 11/26/2019 Elsevier Patient Education  2023 Elsevier Inc.  

## 2022-12-06 ENCOUNTER — Ambulatory Visit (INDEPENDENT_AMBULATORY_CARE_PROVIDER_SITE_OTHER): Payer: Medicare Other

## 2022-12-06 VITALS — Ht 66.0 in | Wt 207.0 lb

## 2022-12-06 DIAGNOSIS — Z0000 Encounter for general adult medical examination without abnormal findings: Secondary | ICD-10-CM

## 2022-12-06 NOTE — Patient Instructions (Signed)
Denise Morris , Thank you for taking time to come for your Medicare Wellness Visit. I appreciate your ongoing commitment to your health goals. Please review the following plan we discussed and let me know if I can assist you in the future.   These are the goals we discussed:  Goals      DIET - EAT MORE FRUITS AND VEGETABLES     DIET - INCREASE WATER INTAKE     Recommend drinking at least 6-8 glasses of water a day      Increase physical activity     RN- Intro to CCM     Current Barriers:  Chronic Disease Management support, education, and care coordination needs related to HTN and HLD  Clinical Goal(s) related to HTN:  Over the next 90 days, patient will:  Work with the care management team to address educational, disease management, and care coordination needs  Begin or continue self health monitoring activities as directed today Measure and record blood pressure 1 times daily Call provider office for new or worsened signs and symptoms Blood pressure findings outside established parameters Call care management team with questions or concerns Verbalize basic understanding of patient centered plan of care established today Exercise two times per week   Interventions related to HTN:  Evaluation of current treatment plans and patient's adherence to plan as established by provider Assessed patient understanding of disease states Assessed patient's education and care coordination needs Provided disease specific education to patient  Patient would like to lose 13lbs to achieve this she is going to start walking with her sister 2 times per week.   Patient Self Care Activities related to HTN:  Patient is unable to independently self-manage chronic health conditions  Initial goal documentation         This is a list of the screening recommended for you and due dates:  Health Maintenance  Topic Date Due   Eye exam for diabetics  Never done   DTaP/Tdap/Td vaccine (2 - Tdap) 03/19/2013    COVID-19 Vaccine (8 - 2023-24 season) 07/19/2022   Yearly kidney health urinalysis for diabetes  12/10/2022   Complete foot exam   12/10/2022   Hemoglobin A1C  12/10/2022   Flu Shot  03/17/2023   Yearly kidney function blood test for diabetes  06/11/2023   Mammogram  09/29/2023   Medicare Annual Wellness Visit  12/06/2023   DEXA scan (bone density measurement)  10/05/2025   Pneumonia Vaccine  Completed   Hepatitis C Screening: USPSTF Recommendation to screen - Ages 11-79 yo.  Completed   Zoster (Shingles) Vaccine  Completed   HPV Vaccine  Aged Out   Colon Cancer Screening  Discontinued    Advanced directives: no  Conditions/risks identified: none  Next appointment: Follow up in one year for your annual wellness visit 12/12/23 @ 9:45 am by phone   Preventive Care 65 Years and Older, Female Preventive care refers to lifestyle choices and visits with your health care provider that can promote health and wellness. What does preventive care include? A yearly physical exam. This is also called an annual well check. Dental exams once or twice a year. Routine eye exams. Ask your health care provider how often you should have your eyes checked. Personal lifestyle choices, including: Daily care of your teeth and gums. Regular physical activity. Eating a healthy diet. Avoiding tobacco and drug use. Limiting alcohol use. Practicing safe sex. Taking low-dose aspirin every day. Taking vitamin and mineral supplements as recommended by your  health care provider. What happens during an annual well check? The services and screenings done by your health care provider during your annual well check will depend on your age, overall health, lifestyle risk factors, and family history of disease. Counseling  Your health care provider may ask you questions about your: Alcohol use. Tobacco use. Drug use. Emotional well-being. Home and relationship well-being. Sexual activity. Eating  habits. History of falls. Memory and ability to understand (cognition). Work and work Astronomer. Reproductive health. Screening  You may have the following tests or measurements: Height, weight, and BMI. Blood pressure. Lipid and cholesterol levels. These may be checked every 5 years, or more frequently if you are over 25 years old. Skin check. Lung cancer screening. You may have this screening every year starting at age 33 if you have a 30-pack-year history of smoking and currently smoke or have quit within the past 15 years. Fecal occult blood test (FOBT) of the stool. You may have this test every year starting at age 55. Flexible sigmoidoscopy or colonoscopy. You may have a sigmoidoscopy every 5 years or a colonoscopy every 10 years starting at age 79. Hepatitis C blood test. Hepatitis B blood test. Sexually transmitted disease (STD) testing. Diabetes screening. This is done by checking your blood sugar (glucose) after you have not eaten for a while (fasting). You may have this done every 1-3 years. Bone density scan. This is done to screen for osteoporosis. You may have this done starting at age 23. Mammogram. This may be done every 1-2 years. Talk to your health care provider about how often you should have regular mammograms. Talk with your health care provider about your test results, treatment options, and if necessary, the need for more tests. Vaccines  Your health care provider may recommend certain vaccines, such as: Influenza vaccine. This is recommended every year. Tetanus, diphtheria, and acellular pertussis (Tdap, Td) vaccine. You may need a Td booster every 10 years. Zoster vaccine. You may need this after age 17. Pneumococcal 13-valent conjugate (PCV13) vaccine. One dose is recommended after age 68. Pneumococcal polysaccharide (PPSV23) vaccine. One dose is recommended after age 68. Talk to your health care provider about which screenings and vaccines you need and how  often you need them. This information is not intended to replace advice given to you by your health care provider. Make sure you discuss any questions you have with your health care provider. Document Released: 08/29/2015 Document Revised: 04/21/2016 Document Reviewed: 06/03/2015 Elsevier Interactive Patient Education  2017 [employer].  Fall Prevention in the Home Falls can cause injuries. They can happen to people of all ages. There are many things you can do to make your home safe and to help prevent falls. What can I do on the outside of my home? Regularly fix the edges of walkways and driveways and fix any cracks. Remove anything that might make you trip as you walk through a door, such as a raised step or threshold. Trim any bushes or trees on the path to your home. Use bright outdoor lighting. Clear any walking paths of anything that might make someone trip, such as rocks or tools. Regularly check to see if handrails are loose or broken. Make sure that both sides of any steps have handrails. Any raised decks and porches should have guardrails on the edges. Have any leaves, snow, or ice cleared regularly. Use sand or salt on walking paths during winter. Clean up any spills in your garage right away. This includes  oil or grease spills. What can I do in the bathroom? Use night lights. Install grab bars by the toilet and in the tub and shower. Do not use towel bars as grab bars. Use non-skid mats or decals in the tub or shower. If you need to sit down in the shower, use a plastic, non-slip stool. Keep the floor dry. Clean up any water that spills on the floor as soon as it happens. Remove soap buildup in the tub or shower regularly. Attach bath mats securely with double-sided non-slip rug tape. Do not have throw rugs and other things on the floor that can make you trip. What can I do in the bedroom? Use night lights. Make sure that you have a light by your bed that is easy to  reach. Do not use any sheets or blankets that are too big for your bed. They should not hang down onto the floor. Have a firm chair that has side arms. You can use this for support while you get dressed. Do not have throw rugs and other things on the floor that can make you trip. What can I do in the kitchen? Clean up any spills right away. Avoid walking on wet floors. Keep items that you use a lot in easy-to-reach places. If you need to reach something above you, use a strong step stool that has a grab bar. Keep electrical cords out of the way. Do not use floor polish or wax that makes floors slippery. If you must use wax, use non-skid floor wax. Do not have throw rugs and other things on the floor that can make you trip. What can I do with my stairs? Do not leave any items on the stairs. Make sure that there are handrails on both sides of the stairs and use them. Fix handrails that are broken or loose. Make sure that handrails are as long as the stairways. Check any carpeting to make sure that it is firmly attached to the stairs. Fix any carpet that is loose or worn. Avoid having throw rugs at the top or bottom of the stairs. If you do have throw rugs, attach them to the floor with carpet tape. Make sure that you have a light switch at the top of the stairs and the bottom of the stairs. If you do not have them, ask someone to add them for you. What else can I do to help prevent falls? Wear shoes that: Do not have high heels. Have rubber bottoms. Are comfortable and fit you well. Are closed at the toe. Do not wear sandals. If you use a stepladder: Make sure that it is fully opened. Do not climb a closed stepladder. Make sure that both sides of the stepladder are locked into place. Ask someone to hold it for you, if possible. Clearly mark and make sure that you can see: Any grab bars or handrails. First and last steps. Where the edge of each step is. Use tools that help you move  around (mobility aids) if they are needed. These include: Canes. Walkers. Scooters. Crutches. Turn on the lights when you go into a dark area. Replace any light bulbs as soon as they burn out. Set up your furniture so you have a clear path. Avoid moving your furniture around. If any of your floors are uneven, fix them. If there are any pets around you, be aware of where they are. Review your medicines with your doctor. Some medicines can make you feel dizzy. This  can increase your chance of falling. Ask your doctor what other things that you can do to help prevent falls. This information is not intended to replace advice given to you by your health care provider. Make sure you discuss any questions you have with your health care provider. Document Released: 05/29/2009 Document Revised: 01/08/2016 Document Reviewed: 09/06/2014 Elsevier Interactive Patient Education  2017 [employer].

## 2022-12-06 NOTE — Progress Notes (Signed)
I connected with  Denise Morris on 12/06/22 by a audio enabled telemedicine application and verified that I am speaking with the correct person using two identifiers.  Patient Location: Home  Provider Location: Office/Clinic  I discussed the limitations of evaluation and management by telemedicine. The patient expressed understanding and agreed to proceed.  Subjective:   Denise Morris is a 77 y.o. female who presents for Medicare Annual (Subsequent) preventive examination.  Review of Systems     Cardiac Risk Factors include: advanced age (>71men, >32 women);hypertension     Objective:    There were no vitals filed for this visit. There is no height or weight on file to calculate BMI.     12/06/2022   10:34 AM 09/21/2021   11:41 AM 09/21/2021   11:22 AM 01/01/2019    9:41 AM 10/22/2018   11:11 AM 11/09/2017    9:38 AM 10/19/2016   10:24 AM  Advanced Directives  Does Patient Have a Medical Advance Directive? No Yes Yes Yes Yes Yes   Type of Physiological scientist Living will;Healthcare Power of [employer] Power of [place];Living will Healthcare Power of [place];Living will   Does patient want to make changes to medical advance directive?   No - Patient declined      Copy of Healthcare Power of Attorney in Chart?  No - copy requested No - copy requested No - copy requested  No - copy requested No - copy requested  Would patient like information on creating a medical advance directive? No - Patient declined          Current Medications (verified) Outpatient Encounter Medications as of 12/06/2022  Medication Sig   aspirin 81 MG tablet Take 81 mg by mouth daily.   chlorthalidone (HYGROTON) 25 MG tablet TAKE 1 TABLET (25 MG TOTAL) BY MOUTH DAILY.   cholecalciferol (VITAMIN D3) 25 MCG (1000 UNIT) tablet Take 1,000 Units by mouth daily.   hydrocortisone 2.5 % cream Apply topically 2 (two) times daily as needed.   metoprolol  succinate (TOPROL-XL) 50 MG 24 hr tablet TAKE 1 TABLET BY MOUTH EVERY DAY WITH OR IMMEDIATELY FOLLOWING A MEAL   nitroGLYCERIN (NITROSTAT) 0.3 MG SL tablet 1 tablet as needed.   olmesartan (BENICAR) 40 MG tablet Take 40 mg by mouth daily.   rosuvastatin (CRESTOR) 20 MG tablet Take 20 mg by mouth daily.   No facility-administered encounter medications on file as of 12/06/2022.    Allergies (verified) Citalopram hydrobromide, Penicillins, and Zestril [lisinopril]   History: Past Medical History:  Diagnosis Date   Anxiety    Depression    Edema    Heart palpitations    Hyperlipidemia    Hypertension    Menopausal state    Osteopenia    Past Surgical History:  Procedure Laterality Date   ABDOMINAL HYSTERECTOMY  1978   Partial ( Precancerous cells)   Family History  Problem Relation Age of Onset   Alzheimer's disease Mother    Hypertension Father    Hypertension Sister    Hyperlipidemia Sister    Breast cancer Neg Hx    Social History   Socioeconomic History   Marital status: Widowed    Spouse name: Not on file   Number of children: Not on file   Years of education: Not on file   Highest education level: Master's degree (e.g., MA, MS, MEng, MEd, MSW, MBA)  Occupational History   Occupation: retired  Tobacco  Use   Smoking status: Never   Smokeless tobacco: Never  Vaping Use   Vaping Use: Never used  Substance and Sexual Activity   Alcohol use: No   Drug use: No   Sexual activity: Yes  Other Topics Concern   Not on file  Social History Narrative   NAR[employer]- meet monthly   [employer]- meet monthly   Social Determinants of Health   Financial Resource Strain: Low Risk  (12/06/2022)   Overall Financial Resource Strain (CARDIA)    Difficulty of Paying Living Expenses: Not hard at all  Food Insecurity: No Food Insecurity (12/06/2022)   Hunger Vital Sign    Worried About Running Out of Food in the Last Year: Never true    Ran Out of Food in the Last Year: Never true   Transportation Needs: No Transportation Needs (12/06/2022)   PRAPARE - Administrator, Civil Service (Medical): No    Lack of Transportation (Non-Medical): No  Physical Activity: Insufficiently Active (12/06/2022)   Exercise Vital Sign    Days of Exercise per Week: 2 days    Minutes of Exercise per Session: 20 min  Stress: No Stress Concern Present (12/06/2022)   [employer] of Occupational Health - Occupational Stress Questionnaire    Feeling of Stress : Not at all  Social Connections: Moderately Integrated (12/06/2022)   Social Connection and Isolation Panel [NHANES]    Frequency of Communication with Friends and Family: More than three times a week    Frequency of Social Gatherings with Friends and Family: More than three times a week    Attends Religious Services: More than 4 times per year    Active Member of [employer] or Organizations: Yes    Attends Banker Meetings: More than 4 times per year    Marital Status: Widowed    Tobacco Counseling Counseling given: Not Answered   Clinical Intake:  Pre-visit preparation completed: Yes  Pain : No/denies pain     Nutritional Risks: None Diabetes: No  How often do you need to have someone help you when you read instructions, pamphlets, or other written materials from your doctor or pharmacy?: 1 - Never  Diabetic?no  Interpreter Needed?: No  Information entered by :: Kennedy Bucker, LPN   Activities of Daily Living    12/06/2022   10:34 AM 12/05/2022    1:13 PM  In your present state of health, do you have any difficulty performing the following activities:  Hearing? 0 0  Vision? 0 0  Difficulty concentrating or making decisions? 0 0  Walking or climbing stairs? 0 0  Dressing or bathing? 0 0  Doing errands, shopping? 0 0  Preparing Food and eating ? N N  Using the Toilet? N N  In the past six months, have you accidently leaked urine? N N  Do you have problems with loss of bowel control? N N   Managing your Medications? N N  Managing your Finances? N N  Housekeeping or managing your Housekeeping? N N    Patient Care Team: Marjie Skiff, NP as PCP - General (Nurse Practitioner) Laurier Nancy, MD as Consulting Physician (Cardiology) Domingo Madeira, OD (Optometry)  Indicate any recent Medical Services you may have received from other than [facility] providers in the past year (date may be approximate).     Assessment:   This is a routine wellness examination for Denise Morris.  Hearing/Vision screen Hearing Screening - Comments:: No aids Vision Screening - Comments:: Wears  glasses- Dr.Nice  Dietary issues and exercise activities discussed: Current Exercise Habits: Home exercise routine, Type of exercise: walking, Time (Minutes): 30, Frequency (Times/Week): 3, Weekly Exercise (Minutes/Week): 90, Intensity: Mild   Goals Addressed             This Visit's Progress    DIET - EAT MORE FRUITS AND VEGETABLES         Depression Screen    12/06/2022   10:32 AM 06/10/2022   10:15 AM 12/09/2021   11:04 AM 09/21/2021   11:39 AM 10/14/2020   10:39 AM 04/08/2020    9:59 AM 03/20/2019    9:16 AM  PHQ 2/9 Scores  PHQ - 2 Score 0 0 0 0 0 0 0  PHQ- 9 Score 0 1 1    2     Fall Risk    12/06/2022   10:34 AM 12/05/2022    1:13 PM 06/10/2022   10:15 AM 12/09/2021   11:04 AM 09/21/2021   11:24 AM  Fall Risk   Falls in the past year? 0 0 0 0 0  Number falls in past yr: 0  0 0 0  Injury with Fall? 0  0 0 0  Risk for fall due to : No Fall Risks  No Fall Risks No Fall Risks   Follow up Falls prevention discussed;Falls evaluation completed  Falls evaluation completed Falls evaluation completed Falls evaluation completed;Falls prevention discussed    FALL RISK PREVENTION PERTAINING TO THE HOME:  Any stairs in or around the home? Yes  If so, are there any without handrails? No  Home free of loose throw rugs in walkways, pet beds, electrical cords, etc? Yes  Adequate lighting in your home to  reduce risk of falls? Yes   ASSISTIVE DEVICES UTILIZED TO PREVENT FALLS:  Life alert? No  Use of a cane, walker or w/c? No  Grab bars in the bathroom? Yes  Shower chair or bench in shower? Yes  Elevated toilet seat or a handicapped toilet? No   Cognitive Function:        12/06/2022   10:39 AM 01/01/2019    9:42 AM 11/09/2017    9:38 AM  6CIT Screen  What Year? 0 points 0 points 0 points  What month? 0 points 0 points 0 points  What time? 0 points 0 points 0 points  Count back from 20 0 points 0 points 0 points  Months in reverse 0 points 0 points 0 points  Repeat phrase 0 points 0 points 0 points  Total Score 0 points 0 points 0 points    Immunizations Immunization History  Administered Date(s) Administered   COVID-19, mRNA, vaccine(Comirnaty)12 years and older 05/24/2022   Fluad Quad(high Dose 65+) 06/04/2019, 06/12/2022   Influenza, High Dose Seasonal PF 05/31/2018   Influenza-Unspecified 05/31/2021   PFIZER Comirnaty(Gray Top)Covid-19 Tri-Sucrose Vaccine 12/08/2020   PFIZER(Purple Top)SARS-COV-2 Vaccination 09/10/2019, 10/01/2019, 05/22/2020, 06/21/2021   Pfizer Covid-19 Vaccine Bivalent Booster 35yrs & up 06/21/2021, 05/24/2022   Pneumococcal Conjugate-13 08/13/2014   Pneumococcal Polysaccharide-23 04/06/2011   Td 03/20/2003   Zoster Recombinat (Shingrix) 11/21/2017, 05/24/2021   Zoster, Live 04/04/2007    TDAP status: Due, Education has been provided regarding the importance of this vaccine. Advised may receive this vaccine at local pharmacy or Health Dept. Aware to provide a copy of the vaccination record if obtained from local pharmacy or Health Dept. Verbalized acceptance and understanding.  Flu Vaccine status: Up to date  Pneumococcal vaccine status: Up to date  Covid-19  vaccine status: Completed vaccines  Qualifies for Shingles Vaccine? Yes   Zostavax completed Yes   Shingrix Completed?: No.    Education has been provided regarding the importance of this  vaccine. Patient has been advised to call insurance company to determine out of pocket expense if they have not yet received this vaccine. Advised may also receive vaccine at local pharmacy or Health Dept. Verbalized acceptance and understanding.  Screening Tests Health Maintenance  Topic Date Due   OPHTHALMOLOGY EXAM  Never done   DTaP/Tdap/Td (2 - Tdap) 03/19/2013   COVID-19 Vaccine (8 - 2023-24 season) 07/19/2022   Diabetic kidney evaluation - Urine ACR  12/10/2022   FOOT EXAM  12/10/2022   HEMOGLOBIN A1C  12/10/2022   INFLUENZA VACCINE  03/17/2023   Diabetic kidney evaluation - eGFR measurement  06/11/2023   MAMMOGRAM  09/29/2023   Medicare Annual Wellness (AWV)  12/06/2023   DEXA SCAN  10/05/2025   Pneumonia Vaccine 8+ Years old  Completed   Hepatitis C Screening  Completed   Zoster Vaccines- Shingrix  Completed   HPV VACCINES  Aged Out   COLONOSCOPY (Pts 45-59yrs Insurance coverage will need to be confirmed)  Discontinued    Health Maintenance  Health Maintenance Due  Topic Date Due   OPHTHALMOLOGY EXAM  Never done   DTaP/Tdap/Td (2 - Tdap) 03/19/2013   COVID-19 Vaccine (8 - 2023-24 season) 07/19/2022   Diabetic kidney evaluation - Urine ACR  12/10/2022    Colorectal cancer screening: No longer required.   Mammogram status: No longer required due to age.- had one on 09/28/22  Bone Density status: Completed 10/06/15. Results reflect: Bone density results: NORMAL. Repeat every 5 years.  Lung Cancer Screening: (Low Dose CT Chest recommended if Age 28-80 years, 30 pack-year currently smoking OR have quit w/in 15years.) does not qualify.   Additional Screening:  Hepatitis C Screening: does qualify; Completed 09/16/15  Vision Screening: Recommended annual ophthalmology exams for early detection of glaucoma and other disorders of the eye. Is the patient up to date with their annual eye exam?  Yes  Who is the provider or what is the name of the office in which the patient  attends annual eye exams? Dr.Nice If pt is not established with a provider, would they like to be referred to a provider to establish care? No .   Dental Screening: Recommended annual dental exams for proper oral hygiene  Community Resource Referral / Chronic Care Management: CRR required this visit?  No   CCM required this visit?  No      Plan:     I have personally reviewed and noted the following in the patient's chart:   Medical and social history Use of alcohol, tobacco or illicit drugs  Current medications and supplements including opioid prescriptions. Patient is not currently taking opioid prescriptions. Functional ability and status Nutritional status Physical activity Advanced directives List of other physicians Hospitalizations, surgeries, and ER visits in previous 12 months Vitals Screenings to include cognitive, depression, and falls Referrals and appointments  In addition, I have reviewed and discussed with patient certain preventive protocols, quality metrics, and best practice recommendations. A written personalized care plan for preventive services as well as general preventive health recommendations were provided to patient.     Hal Hope, LPN   1/61/0960   Nurse Notes: none

## 2022-12-10 ENCOUNTER — Ambulatory Visit (INDEPENDENT_AMBULATORY_CARE_PROVIDER_SITE_OTHER): Payer: Medicare Other | Admitting: Nurse Practitioner

## 2022-12-10 ENCOUNTER — Encounter: Payer: Self-pay | Admitting: Nurse Practitioner

## 2022-12-10 VITALS — BP 104/68 | HR 54 | Temp 98.3°F | Ht 65.98 in | Wt 209.5 lb

## 2022-12-10 DIAGNOSIS — E6609 Other obesity due to excess calories: Secondary | ICD-10-CM | POA: Diagnosis not present

## 2022-12-10 DIAGNOSIS — I152 Hypertension secondary to endocrine disorders: Secondary | ICD-10-CM | POA: Diagnosis not present

## 2022-12-10 DIAGNOSIS — N1831 Chronic kidney disease, stage 3a: Secondary | ICD-10-CM

## 2022-12-10 DIAGNOSIS — R809 Proteinuria, unspecified: Secondary | ICD-10-CM | POA: Diagnosis not present

## 2022-12-10 DIAGNOSIS — I700 Atherosclerosis of aorta: Secondary | ICD-10-CM

## 2022-12-10 DIAGNOSIS — E785 Hyperlipidemia, unspecified: Secondary | ICD-10-CM

## 2022-12-10 DIAGNOSIS — E1159 Type 2 diabetes mellitus with other circulatory complications: Secondary | ICD-10-CM | POA: Diagnosis not present

## 2022-12-10 DIAGNOSIS — E1129 Type 2 diabetes mellitus with other diabetic kidney complication: Secondary | ICD-10-CM

## 2022-12-10 DIAGNOSIS — E669 Obesity, unspecified: Secondary | ICD-10-CM

## 2022-12-10 DIAGNOSIS — E1169 Type 2 diabetes mellitus with other specified complication: Secondary | ICD-10-CM

## 2022-12-10 DIAGNOSIS — Z6832 Body mass index (BMI) 32.0-32.9, adult: Secondary | ICD-10-CM

## 2022-12-10 LAB — MICROALBUMIN, URINE WAIVED
Creatinine, Urine Waived: 200 mg/dL (ref 10–300)
Microalb, Ur Waived: 80 mg/L — ABNORMAL HIGH (ref 0–19)

## 2022-12-10 LAB — BAYER DCA HB A1C WAIVED: HB A1C (BAYER DCA - WAIVED): 6.6 % — ABNORMAL HIGH (ref 4.8–5.6)

## 2022-12-10 MED ORDER — ONETOUCH VERIO W/DEVICE KIT: PACK | 0 refills | Status: AC

## 2022-12-10 MED ORDER — NITROGLYCERIN 0.3 MG SL SUBL: 0.3000 mg | SUBLINGUAL_TABLET | SUBLINGUAL | 1 refills | Status: AC | PRN

## 2022-12-10 MED ORDER — ONETOUCH ULTRASOFT 2 LANCETS MISC: 1.0000 | Freq: Two times a day (BID) | 3 refills | Status: AC

## 2022-12-10 MED ORDER — ONETOUCH VERIO VI STRP: ORAL_STRIP | 12 refills | Status: AC

## 2022-12-10 NOTE — Assessment & Plan Note (Addendum)
Ongoing.  Educated patient on findings and findings with her HTN.  Continue Olmesartan for kidney protection with her proteinuria and renal dose medications as needed.  Avoid NSAIDs and contrast unless needed.  CMP today.  Could consider Marcelline Deist in future and stopping Chlorthalidone -- to allow diuretic affect maintained but extra kidney and diabetes protection CKD.  Urine ALB 80 April 2024.

## 2022-12-10 NOTE — Addendum Note (Signed)
Addended by: Aura Dials T on: 12/10/2022 11:34 AM   Modules accepted: Orders

## 2022-12-10 NOTE — Assessment & Plan Note (Signed)
Chronic, stable.  BP well below goal.  Continue current medication regimen and collaboration with cardiology.  Continue to monitor BP at home regularly and focus on DASH diet.  LABS: CMP and urine ALB.  Could consider Marcelline Deist in future and stopping Chlorthalidone -- to allow diuretic affect maintained but extra kidney and diabetes protection. Urine ALB 80 April 2024.  Return in 6 months for follow-up.

## 2022-12-10 NOTE — Assessment & Plan Note (Signed)
Chronic.  Noted on imaging 11/21/2017.  Educated patient on this finding.  Recommend continue statin and ASA daily for prevention. 

## 2022-12-10 NOTE — Assessment & Plan Note (Signed)
Chronic, ongoing.  Continue current medication regimen and adjust as needed. Lipid panel today. 

## 2022-12-10 NOTE — Assessment & Plan Note (Signed)
Chronic, ongoing.  Diet-controlled.  A1c 6.6% today. Urine ALB 80 April 2024.  Check CMP today.   Could consider Farxiga in future and stopping Chlorthalidone -- to allow diuretic affect maintained but extra kidney and diabetes protection. Consider nephrology referral if worsening CKD. 

## 2022-12-10 NOTE — Assessment & Plan Note (Signed)
BMI 33.38.  Recommended eating smaller high protein, low fat meals more frequently and exercising 30 mins a day 5 times a week with a goal of 10-15lb weight loss in the next 3 months. Patient voiced their understanding and motivation to adhere to these recommendations.

## 2022-12-10 NOTE — Progress Notes (Addendum)
BP 104/68   Pulse (!) 54   Temp 98.3 F (36.8 C) (Oral)   Ht 5' 5.98" (1.676 m)   Wt 209 lb 8 oz (95 kg)   LMP  (LMP Unknown)   SpO2 96%   BMI 33.83 kg/m    Subjective:    Patient ID: Denise Morris, female    DOB: [birth year], 77 y.o.   MRN: [id number]  HPI: Denise Morris is a 77 y.o. female  Chief Complaint  Patient presents with   Diabetes   Chronic Kidney Disease   Hypertension   DIABETES A1c initially elevated at 6.6% in August 2021. A1c 6.5% in October.  Diet controlled at this time. Hypoglycemic episodes:no Polydipsia/polyuria: no Visual disturbance: no Chest pain: no Paresthesias: no Glucose Monitoring: no  Accucheck frequency: Not Checking  Fasting glucose:  Post prandial:  Evening:  Before meals: Taking Insulin?: no  Long acting insulin:  Short acting insulin: Blood Pressure Monitoring: weekly Retinal Examination: Up to Date -- Dr. Larence Penning -- July 2023 Foot Exam: Up to Date Pneumovax: Up to Date Influenza: Up To Date Aspirin: yes   HYPERTENSION / HYPERLIPIDEMIA Continues on Olmesartan 40 MG, Metoprolol XL 50 MG, Hygroton 25 MG, and ASA + Crestor for HLD.  Follows with Dr. Welton Flakes with cardiology, last saw one month ago -- no changes made.  Last echo May 2020 == EF 60% noted in chart, no recent notes available.  Aortic atherosclerosis noted on imaging, CXR in 2019. Satisfied with current treatment? yes Duration of hypertension: chronic BP monitoring frequency: occasionally BP range: averages <130/80 on average BP medication side effects: no Duration of hyperlipidemia: chronic Cholesterol medication side effects: no Cholesterol supplements: none Medication compliance: good compliance Aspirin: yes Recent stressors: no Recurrent headaches: no Visual changes: no Palpitations: occasional  Dyspnea: no Chest pain: no Lower extremity edema: no Dizzy/lightheaded: no   CHRONIC KIDNEY DISEASE CKD status: stable Medications renally dose: yes Previous  renal evaluation: yes Pneumovax:  Up to Date Influenza Vaccine:  Up to Date  Relevant past medical, surgical, family and social history reviewed and updated as indicated. Interim medical history since our last visit reviewed. Allergies and medications reviewed and updated.  Review of Systems  Constitutional:  Negative for activity change, appetite change, diaphoresis, fatigue and fever.  Respiratory:  Negative for cough, chest tightness, shortness of breath and wheezing.   Cardiovascular:  Negative for chest pain, palpitations and leg swelling.  Gastrointestinal: Negative.   Endocrine: Negative for polydipsia, polyphagia and polyuria.  Neurological: Negative.   Psychiatric/Behavioral: Negative.      Per HPI unless specifically indicated above     Objective:    BP 104/68   Pulse (!) 54   Temp 98.3 F (36.8 C) (Oral)   Ht 5' 5.98" (1.676 m)   Wt 209 lb 8 oz (95 kg)   LMP  (LMP Unknown)   SpO2 96%   BMI 33.83 kg/m   Wt Readings from Last 3 Encounters:  12/10/22 209 lb 8 oz (95 kg)  12/06/22 207 lb (93.9 kg)  11/29/22 207 lb 3.2 oz (94 kg)    Physical Exam Vitals and nursing note reviewed.  Constitutional:      General: She is awake. She is not in acute distress.    Appearance: She is well-developed and well-groomed. She is obese. She is not ill-appearing.  HENT:     Head: Normocephalic.     Right Ear: Hearing normal.     Left Ear: Hearing normal.  Eyes:     General: Lids are normal.        Right eye: No discharge.        Left eye: No discharge.     Conjunctiva/sclera: Conjunctivae normal.     Pupils: Pupils are equal, round, and reactive to light.  Neck:     Thyroid: No thyromegaly.     Vascular: No carotid bruit.  Cardiovascular:     Rate and Rhythm: Normal rate and regular rhythm.     Heart sounds: Normal heart sounds. No murmur heard.    No gallop.  Pulmonary:     Effort: Pulmonary effort is normal. No accessory muscle usage or respiratory distress.      Breath sounds: Normal breath sounds.  Abdominal:     General: Bowel sounds are normal.     Palpations: Abdomen is soft.  Musculoskeletal:     Cervical back: Normal range of motion and neck supple.     Left lower leg: 2+ Edema present.  Skin:    General: Skin is warm and dry.  Neurological:     Mental Status: She is alert and oriented to person, place, and time.  Psychiatric:        Attention and Perception: Attention normal.        Mood and Affect: Mood normal.        Speech: Speech normal.        Behavior: Behavior normal. Behavior is cooperative.        Thought Content: Thought content normal.    Diabetic Foot Exam - Simple   Simple Foot Form Visual Inspection See comments: Yes Sensation Testing Intact to touch and monofilament testing bilaterally: Yes Pulse Check Posterior Tibialis and Dorsalis pulse intact bilaterally: Yes Comments Edema 2+ both feet.     Results for orders placed or performed in visit on 06/10/22  Bayer DCA Hb A1c Waived  Result Value Ref Range   HB A1C (BAYER DCA - WAIVED) 6.5 (H) 4.8 - 5.6 %  CBC with Differential/Platelet  Result Value Ref Range   WBC 5.0 3.4 - 10.8 x10E3/uL   RBC 4.56 3.77 - 5.28 x10E6/uL   Hemoglobin 13.5 11.1 - 15.9 g/dL   Hematocrit 60.4 54.0 - 46.6 %   MCV 92 79 - 97 fL   MCH 29.6 26.6 - 33.0 pg   MCHC 32.3 31.5 - 35.7 g/dL   RDW 98.1 19.1 - 47.8 %   Platelets 168 150 - 450 x10E3/uL   Neutrophils 41 Not Estab. %   Lymphs 43 Not Estab. %   Monocytes 8 Not Estab. %   Eos 7 Not Estab. %   Basos 1 Not Estab. %   Neutrophils Absolute 2.0 1.4 - 7.0 x10E3/uL   Lymphocytes Absolute 2.2 0.7 - 3.1 x10E3/uL   Monocytes Absolute 0.4 0.1 - 0.9 x10E3/uL   EOS (ABSOLUTE) 0.3 0.0 - 0.4 x10E3/uL   Basophils Absolute 0.1 0.0 - 0.2 x10E3/uL   Immature Granulocytes 0 Not Estab. %   Immature Grans (Abs) 0.0 0.0 - 0.1 x10E3/uL  Comprehensive metabolic panel  Result Value Ref Range   Glucose 119 (H) 70 - 99 mg/dL   BUN 17 8 - 27  mg/dL   Creatinine, Ser 2.95 (H) 0.57 - 1.00 mg/dL   eGFR 46 (L) >62 ZH/YQM/5.78   BUN/Creatinine Ratio 14 12 - 28   Sodium 140 134 - 144 mmol/L   Potassium 3.9 3.5 - 5.2 mmol/L   Chloride 102 96 - 106 mmol/L  CO2 25 20 - 29 mmol/L   Calcium 8.9 8.7 - 10.3 mg/dL   Total Protein 6.8 6.0 - 8.5 g/dL   Albumin 4.1 3.8 - 4.8 g/dL   Globulin, Total 2.7 1.5 - 4.5 g/dL   Albumin/Globulin Ratio 1.5 1.2 - 2.2   Bilirubin Total 0.3 0.0 - 1.2 mg/dL   Alkaline Phosphatase 65 44 - 121 IU/L   AST 20 0 - 40 IU/L   ALT 17 0 - 32 IU/L  Lipid Panel w/o Chol/HDL Ratio  Result Value Ref Range   Cholesterol, Total 124 100 - 199 mg/dL   Triglycerides 67 0 - 149 mg/dL   HDL 54 >40 mg/dL   VLDL Cholesterol Cal 14 5 - 40 mg/dL   LDL Chol Calc (NIH) 56 0 - 99 mg/dL  TSH  Result Value Ref Range   TSH 1.290 0.450 - 4.500 uIU/mL  VITAMIN D 25 Hydroxy (Vit-D Deficiency, Fractures)  Result Value Ref Range   Vit D, 25-Hydroxy 104.0 (H) 30.0 - 100.0 ng/mL      Assessment & Plan:   Problem List Items Addressed This Visit       Cardiovascular and Mediastinum   Aortic atherosclerosis (HCC)    Chronic.  Noted on imaging 11/21/2017.  Educated patient on this finding.  Recommend continue statin and ASA daily for prevention.      Relevant Medications   nitroGLYCERIN (NITROSTAT) 0.3 MG SL tablet   Hypertension associated with diabetes (HCC)    Chronic, stable.  BP well below goal.  Continue current medication regimen and collaboration with cardiology.  Continue to monitor BP at home regularly and focus on DASH diet.  LABS: CMP and urine ALB.  Could consider Marcelline Deist in future and stopping Chlorthalidone -- to allow diuretic affect maintained but extra kidney and diabetes protection. Urine ALB 80 April 2024.  Return in 6 months for follow-up.      Relevant Medications   nitroGLYCERIN (NITROSTAT) 0.3 MG SL tablet   Other Relevant Orders   Bayer DCA Hb A1c Waived   Microalbumin, Urine Waived   Comprehensive  metabolic panel     Endocrine   Hyperlipidemia associated with type 2 diabetes mellitus (HCC)    Chronic, ongoing.  Continue current medication regimen and adjust as needed.  Lipid panel today.      Relevant Medications   nitroGLYCERIN (NITROSTAT) 0.3 MG SL tablet   Other Relevant Orders   Bayer DCA Hb A1c Waived   Comprehensive metabolic panel   Lipid Panel w/o Chol/HDL Ratio   Type 2 diabetes mellitus with obesity (HCC)    Chronic, ongoing.  Diet-controlled.  A1c 6.6% today. Urine ALB 80 April 2024.  Check CMP today.   Could consider Marcelline Deist in future and stopping Chlorthalidone -- to allow diuretic affect maintained but extra kidney and diabetes protection. Consider nephrology referral if worsening CKD.      Relevant Orders   Bayer DCA Hb A1c Waived   Microalbumin, Urine Waived   Type 2 diabetes mellitus with proteinuria (HCC) - Primary    Chronic, ongoing.  Diet-controlled.  A1c 6.6% today.  Urine ALB 80 April 2024.  Check CMP today.   Could consider Marcelline Deist in future and stopping Chlorthalidone -- to allow diuretic affect maintained but extra kidney and diabetes protection. Consider nephrology referral if worsening CKD.      Relevant Orders   Bayer DCA Hb A1c Waived   Microalbumin, Urine Waived     Genitourinary   CKD (chronic kidney disease) stage  3, GFR 30-59 ml/min (HCC)    Ongoing.  Educated patient on findings and findings with her HTN.  Continue Olmesartan for kidney protection with her proteinuria and renal dose medications as needed.  Avoid NSAIDs and contrast unless needed.  CMP today.  Could consider Marcelline Deist in future and stopping Chlorthalidone -- to allow diuretic affect maintained but extra kidney and diabetes protection CKD.  Urine ALB 80 April 2024.      Relevant Orders   Microalbumin, Urine Waived   Comprehensive metabolic panel     Other   Obesity    BMI 33.38.  Recommended eating smaller high protein, low fat meals more frequently and exercising 30 mins a  day 5 times a week with a goal of 10-15lb weight loss in the next 3 months. Patient voiced their understanding and motivation to adhere to these recommendations.         Follow up plan: Return in about 6 months (around 06/11/2023) for Annual physical after 06/11/23.

## 2022-12-10 NOTE — Assessment & Plan Note (Signed)
Chronic, ongoing.  Diet-controlled.  A1c 6.6% today. Urine ALB 80 April 2024.  Check CMP today.   Could consider Marcelline Deist in future and stopping Chlorthalidone -- to allow diuretic affect maintained but extra kidney and diabetes protection. Consider nephrology referral if worsening CKD.

## 2022-12-11 LAB — COMPREHENSIVE METABOLIC PANEL
ALT: 17 IU/L (ref 0–32)
AST: 22 IU/L (ref 0–40)
Albumin/Globulin Ratio: 1.5 (ref 1.2–2.2)
Albumin: 3.7 g/dL — ABNORMAL LOW (ref 3.8–4.8)
Alkaline Phosphatase: 63 IU/L (ref 44–121)
BUN/Creatinine Ratio: 15 (ref 12–28)
BUN: 19 mg/dL (ref 8–27)
Bilirubin Total: 0.3 mg/dL (ref 0.0–1.2)
CO2: 24 mmol/L (ref 20–29)
Calcium: 9.2 mg/dL (ref 8.7–10.3)
Chloride: 107 mmol/L — ABNORMAL HIGH (ref 96–106)
Creatinine, Ser: 1.29 mg/dL — ABNORMAL HIGH (ref 0.57–1.00)
Globulin, Total: 2.5 g/dL (ref 1.5–4.5)
Glucose: 106 mg/dL — ABNORMAL HIGH (ref 70–99)
Potassium: 3.7 mmol/L (ref 3.5–5.2)
Sodium: 143 mmol/L (ref 134–144)
Total Protein: 6.2 g/dL (ref 6.0–8.5)
eGFR: 42 mL/min/{1.73_m2} — ABNORMAL LOW (ref >59)

## 2022-12-11 LAB — LIPID PANEL W/O CHOL/HDL RATIO
Cholesterol, Total: 124 mg/dL (ref 100–199)
HDL: 58 mg/dL (ref >39)
LDL Chol Calc (NIH): 53 mg/dL (ref 0–99)
Triglycerides: 57 mg/dL (ref 0–149)
VLDL Cholesterol Cal: 13 mg/dL (ref 5–40)

## 2022-12-12 NOTE — Progress Notes (Signed)
Contacted via MyChart   Good morning Safaa, your labs have returned: - Kidney function, creatinine and eGFR, continues to show stage 3 kidney disease which we will monitor closely.  Slight trend down this visit.  Continue Olmesartan.  Avoid Ibuprofen products.  Drink plenty of water with lemon added. - Cholesterol labs are at goal.  Continue all current medications.  Any questions? Keep being amazing!!  Thank you for allowing me to participate in your care.  I appreciate you. Kindest regards, Fleeta Kunde

## 2022-12-13 ENCOUNTER — Other Ambulatory Visit: Payer: Self-pay | Admitting: Nurse Practitioner

## 2022-12-14 NOTE — Telephone Encounter (Signed)
Requested medication (s) are due for refill today - expired Rx  Requested medication (s) are on the active medication list - yes  Future visit scheduled -yes  Last refill: 12/09/21 30g 2RF  Notes to clinic: off protocol- provider review   Requested Prescriptions  Pending Prescriptions Disp Refills   hydrocortisone 2.5 % cream [Pharmacy Med Name: HYDROCORTISONE 2.5% CREAM] 28.35 g 3    Sig: APPLY TOPICALLY 2 TIMES DAILY AS NEEDED.     Off-Protocol Failed - 12/13/2022  2:36 PM      Failed - Medication not assigned to a protocol, review manually.      Passed - Valid encounter within last 12 months    Recent Outpatient Visits           4 days ago Type 2 diabetes mellitus with proteinuria (HCC)   [facility] [place], [place] T, NP   6 months ago Type 2 diabetes mellitus with obesity (HCC)   [facility] [place], Corrie Dandy T, NP   1 year ago Type 2 diabetes mellitus with obesity (HCC)   [facility] [place], Corrie Dandy T, NP   1 year ago Stage 3a chronic kidney disease (HCC)   [facility] [place], Corrie Dandy T, NP   2 years ago Primary hypertension   [facility] Aura Dials T, NP       Future Appointments             In 2 months Laurier Nancy, MD [facility]   In 6 months [place], Dorie Rank, NP [facility]               Requested Prescriptions  Pending Prescriptions Disp Refills   hydrocortisone 2.5 % cream [Pharmacy Med Name: HYDROCORTISONE 2.5% CREAM] 28.35 g 3    Sig: APPLY TOPICALLY 2 TIMES DAILY AS NEEDED.     Off-Protocol Failed - 12/13/2022  2:36 PM      Failed - Medication not assigned to a protocol, review manually.      Passed - Valid encounter within last 12 months    Recent Outpatient Visits           4 days ago Type 2 diabetes mellitus with proteinuria (HCC)   [facility] [place], [place] T, NP   6 months ago Type 2 diabetes mellitus with obesity (HCC)   [facility] [place], Corrie Dandy T, NP   1 year ago Type 2 diabetes mellitus with obesity (HCC)   [facility] [place], Corrie Dandy T, NP   1 year ago Stage 3a chronic kidney disease (HCC)   [place] [place], Corrie Dandy T, NP   2 years ago Primary hypertension   [place] [facility] [place], Corrie Dandy T, NP       Future Appointments             In 2 months Laurier Nancy, MD [facility]   In 6 months [place], Dorie Rank, NP [facility]

## 2022-12-22 NOTE — Progress Notes (Signed)
Called  and spoke to [place], she stated that she would get that faxed over today

## 2022-12-23 ENCOUNTER — Encounter: Payer: Self-pay | Admitting: Nurse Practitioner

## 2023-02-28 ENCOUNTER — Encounter: Payer: Self-pay | Admitting: Cardiovascular Disease

## 2023-02-28 ENCOUNTER — Ambulatory Visit (INDEPENDENT_AMBULATORY_CARE_PROVIDER_SITE_OTHER): Payer: Medicare Other | Admitting: Cardiovascular Disease

## 2023-02-28 VITALS — BP 130/70 | HR 71 | Ht 66.0 in | Wt 206.0 lb

## 2023-02-28 DIAGNOSIS — E1159 Type 2 diabetes mellitus with other circulatory complications: Secondary | ICD-10-CM | POA: Diagnosis not present

## 2023-02-28 DIAGNOSIS — I152 Hypertension secondary to endocrine disorders: Secondary | ICD-10-CM | POA: Diagnosis not present

## 2023-02-28 DIAGNOSIS — E785 Hyperlipidemia, unspecified: Secondary | ICD-10-CM

## 2023-02-28 DIAGNOSIS — I700 Atherosclerosis of aorta: Secondary | ICD-10-CM | POA: Diagnosis not present

## 2023-02-28 DIAGNOSIS — E1169 Type 2 diabetes mellitus with other specified complication: Secondary | ICD-10-CM | POA: Diagnosis not present

## 2023-02-28 DIAGNOSIS — R002 Palpitations: Secondary | ICD-10-CM | POA: Diagnosis not present

## 2023-02-28 DIAGNOSIS — Q245 Malformation of coronary vessels: Secondary | ICD-10-CM | POA: Diagnosis not present

## 2023-02-28 NOTE — Progress Notes (Signed)
Cardiology Office Note   Date:  02/28/2023   ID:  Denise Morris, DOB 01/05/1946, MRN [id number]  PCP:  Marjie Skiff, NP  Cardiologist:  Adrian Blackwater, MD      History of Present Illness: Denise Morris is a 77 y.o. female who presents for  Chief Complaint  Patient presents with   Follow-up    3 mo F/U    ON JULY 6 HAD 1 EPISODE OF PALPITATION NEEDING EXTRA DOSE OF METOPROLOL      Past Medical History:  Diagnosis Date   Anxiety    Depression    Edema    Heart palpitations    Hyperlipidemia    Hypertension    Menopausal state    Osteopenia      Past Surgical History:  Procedure Laterality Date   ABDOMINAL HYSTERECTOMY  1978   Partial ( Precancerous cells)     Current Outpatient Medications  Medication Sig Dispense Refill   aspirin 81 MG tablet Take 81 mg by mouth daily.     Blood Glucose Monitoring Suppl (ONETOUCH VERIO) w/Device KIT Use to check blood sugar 3 times a day and document results, bring to appointments.  Goal is <130 fasting blood sugar and <180 two hours after meals. 1 kit 0   chlorthalidone (HYGROTON) 25 MG tablet TAKE 1 TABLET (25 MG TOTAL) BY MOUTH DAILY. 90 tablet 4   cholecalciferol (VITAMIN D3) 25 MCG (1000 UNIT) tablet Take 1,000 Units by mouth daily.     glucose blood (ONETOUCH VERIO) test strip Use to check blood sugar 3 times a day and document results, bring to appointments.  Goal is <130 fasting blood sugar and <180 two hours after meals. 100 each 12   hydrocortisone 2.5 % cream APPLY TOPICALLY 2 TIMES DAILY AS NEEDED. 28.35 g 3   metoprolol succinate (TOPROL-XL) 50 MG 24 hr tablet TAKE 1 TABLET BY MOUTH EVERY DAY WITH OR IMMEDIATELY FOLLOWING A MEAL 90 tablet 4   nitroGLYCERIN (NITROSTAT) 0.3 MG SL tablet Place 1 tablet (0.3 mg total) under the tongue every 5 (five) minutes as needed. 90 tablet 1   olmesartan (BENICAR) 40 MG tablet Take 40 mg by mouth daily.     OneTouch UltraSoft 2 Lancets MISC 1 applicator by Does not apply  route in the morning and at bedtime. Use to check blood sugar 3 times a day and document results, bring to appointments.  Goal is <130 fasting blood sugar and <180 two hours after meals. 100 each 3   rosuvastatin (CRESTOR) 20 MG tablet Take 20 mg by mouth daily.     No current facility-administered medications for this visit.    Allergies:   Citalopram hydrobromide, Penicillins, and Zestril [lisinopril]    Social History:   reports that she has never smoked. She has never used smokeless tobacco. She reports that she does not drink alcohol and does not use drugs.   Family History:  family history includes Alzheimer's disease in her mother; Hyperlipidemia in her sister; Hypertension in her father and sister.    ROS:     Review of Systems  Constitutional: Negative.   HENT: Negative.    Eyes: Negative.   Respiratory: Negative.    Gastrointestinal: Negative.   Genitourinary: Negative.   Musculoskeletal: Negative.   Skin: Negative.   Neurological: Negative.   Endo/Heme/Allergies: Negative.   Psychiatric/Behavioral: Negative.    All other systems reviewed and are negative.     All other systems are reviewed and negative.  PHYSICAL EXAM: VS:  BP 130/70   Pulse 71   Ht 5\' 6"  (1.676 m)   Wt 206 lb (93.4 kg)   LMP  (LMP Unknown)   SpO2 96%   BMI 33.25 kg/m  , BMI Body mass index is 33.25 kg/m. Last weight:  Wt Readings from Last 3 Encounters:  02/28/23 206 lb (93.4 kg)  12/10/22 209 lb 8 oz (95 kg)  12/06/22 207 lb (93.9 kg)     Physical Exam Constitutional:      Appearance: Normal appearance.  Cardiovascular:     Rate and Rhythm: Normal rate and regular rhythm.     Heart sounds: Normal heart sounds.  Pulmonary:     Effort: Pulmonary effort is normal.     Breath sounds: Normal breath sounds.  Musculoskeletal:     Right lower leg: No edema.     Left lower leg: No edema.  Neurological:     Mental Status: She is alert.       EKG:   Recent Labs: 06/10/2022:  Hemoglobin 13.5; Platelets 168; TSH 1.290 12/10/2022: ALT 17; BUN 19; Creatinine, Ser 1.29; Potassium 3.7; Sodium 143    Lipid Panel    Component Value Date/Time   CHOL 124 12/10/2022 1013   CHOL 148 02/12/2015 1022   TRIG 57 12/10/2022 1013   TRIG 80 02/12/2015 1022   HDL 58 12/10/2022 1013   CHOLHDL 3.7 05/31/2018 1353   VLDL 16 02/12/2015 1022   LDLCALC 53 12/10/2022 1013      Other studies Reviewed: Additional studies/ records that were reviewed today include:  Review of the above records demonstrates:       No data to display            ASSESSMENT AND PLAN:    ICD-10-CM   1. 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 Aortic atherosclerosis (HCC)  I70.0     3. Hypertension associated with diabetes (HCC)  E11.59    I15.2     4. Hyperlipidemia associated with type 2 diabetes mellitus (HCC)  E11.69    E78.5     5. Palpitation  R00.2    INTERMITTANT PALPITATION DUE TO ANOMLOUS CORONARY       Problem List Items Addressed This Visit       Cardiovascular and Mediastinum   Hypertension associated with diabetes (HCC)   Anomalous coronary artery origin - Primary   Aortic atherosclerosis (HCC)     Endocrine   Hyperlipidemia associated with type 2 diabetes mellitus (HCC)   Other Visit Diagnoses     Palpitation       INTERMITTANT PALPITATION DUE TO ANOMLOUS CORONARY          Disposition:   Return in about 3 months (around 05/31/2023).    Total time spent: 30 minutes  Signed,  Adrian Blackwater, MD  02/28/2023 10:39 AM    [facility]

## 2023-03-04 ENCOUNTER — Other Ambulatory Visit: Payer: Self-pay | Admitting: Cardiovascular Disease

## 2023-04-28 DIAGNOSIS — H2513 Age-related nuclear cataract, bilateral: Secondary | ICD-10-CM | POA: Diagnosis not present

## 2023-04-28 DIAGNOSIS — I10 Essential (primary) hypertension: Secondary | ICD-10-CM | POA: Diagnosis not present

## 2023-04-28 DIAGNOSIS — H35033 Hypertensive retinopathy, bilateral: Secondary | ICD-10-CM | POA: Diagnosis not present

## 2023-04-28 DIAGNOSIS — H524 Presbyopia: Secondary | ICD-10-CM | POA: Diagnosis not present

## 2023-04-28 DIAGNOSIS — H40013 Open angle with borderline findings, low risk, bilateral: Secondary | ICD-10-CM | POA: Diagnosis not present

## 2023-06-12 NOTE — Patient Instructions (Signed)
Be Involved in Caring For Your Health:  Taking Medications When medications are taken as directed, they can greatly improve your health. But if they are not taken as prescribed, they may not work. In some cases, not taking them correctly can be harmful. To help ensure your treatment remains effective and safe, understand your medications and how to take them. Bring your medications to each visit for review by your provider.  Your lab results, notes, and after visit summary will be available on My Chart. We strongly encourage you to use this feature. If lab results are abnormal the clinic will contact you with the appropriate steps. If the clinic does not contact you assume the results are satisfactory. You can always view your results on My Chart. If you have questions regarding your health or results, please contact the clinic during office hours. You can also ask questions on My Chart.  We at [facility] are grateful that you chose us to provide your care. We strive to provide evidence-based and compassionate care and are always looking for feedback. If you get a survey from the clinic please complete this so we can hear your opinions.  DASH Eating Plan DASH stands for Dietary Approaches to Stop Hypertension. The DASH eating plan is a healthy eating plan that has been shown to: Lower high blood pressure (hypertension). Reduce your risk for type 2 diabetes, heart disease, and stroke. Help with weight loss. What are tips for following this plan? Reading food labels Check food labels for the amount of salt (sodium) per serving. Choose foods with less than 5 percent of the Daily Value (DV) of sodium. In general, foods with less than 300 milligrams (mg) of sodium per serving fit into this eating plan. To find whole grains, look for the word "whole" as the first word in the ingredient list. Shopping Buy products labeled as "low-sodium" or "no salt added." Buy fresh foods. Avoid canned  foods and pre-made or frozen meals. Cooking Try not to add salt when you cook. Use salt-free seasonings or herbs instead of table salt or sea salt. Check with your health care provider or pharmacist before using salt substitutes. Do not fry foods. Cook foods in healthy ways, such as baking, boiling, grilling, roasting, or broiling. Cook using oils that are good for your heart. These include olive, canola, avocado, soybean, and sunflower oil. Meal planning  Eat a balanced diet. This should include: 4 or more servings of fruits and 4 or more servings of vegetables each day. Try to fill half of your plate with fruits and vegetables. 6-8 servings of whole grains each day. 6 or less servings of lean meat, poultry, or fish each day. 1 oz is 1 serving. A 3 oz (85 g) serving of meat is about the same size as the palm of your hand. One egg is 1 oz (28 g). 2-3 servings of low-fat dairy each day. One serving is 1 cup (237 mL). 1 serving of nuts, seeds, or beans 5 times each week. 2-3 servings of heart-healthy fats. Healthy fats called omega-3 fatty acids are found in foods such as walnuts, flaxseeds, fortified milks, and eggs. These fats are also found in cold-water fish, such as sardines, salmon, and mackerel. Limit how much you eat of: Canned or prepackaged foods. Food that is high in trans fat, such as fried foods. Food that is high in saturated fat, such as fatty meat. Desserts and other sweets, sugary drinks, and other foods with added sugar. Full-fat   dairy products. Do not salt foods before eating. Do not eat more than 4 egg yolks a week. Try to eat at least 2 vegetarian meals a week. Eat more home-cooked food and less restaurant, buffet, and fast food. Lifestyle When eating at a restaurant, ask if your food can be made with less salt or no salt. If you drink alcohol: Limit how much you have to: 0-1 drink a day if you are female. 0-2 drinks a day if you are female. Know how much alcohol is in  your drink. In the [place] one drink is one 12 oz bottle of beer (355 mL), one 5 oz glass of wine (148 mL), or one 1 oz glass of hard liquor (44 mL). General information Avoid eating more than 2,300 mg of salt a day. If you have hypertension, you may need to reduce your sodium intake to 1,500 mg a day. Work with your provider to stay at a healthy body weight or lose weight. Ask what the best weight range is for you. On most days of the week, get at least 30 minutes of exercise that causes your heart to beat faster. This may include walking, swimming, or biking. Work with your provider or dietitian to adjust your eating plan to meet your specific calorie needs. What foods should I eat? Fruits All fresh, dried, or frozen fruit. Canned fruits that are in their natural juice and do not have sugar added to them. Vegetables Fresh or frozen vegetables that are raw, steamed, roasted, or grilled. Low-sodium or reduced-sodium tomato and vegetable juice. Low-sodium or reduced-sodium tomato sauce and tomato paste. Low-sodium or reduced-sodium canned vegetables. Grains Whole-grain or whole-wheat bread. Whole-grain or whole-wheat pasta. Brown rice. Oatmeal. Quinoa. Bulgur. Whole-grain and low-sodium cereals. Pita bread. Low-fat, low-sodium crackers. Whole-wheat flour tortillas. Meats and other proteins Skinless chicken or turkey. Ground chicken or turkey. Pork with fat trimmed off. Fish and seafood. Egg whites. Dried beans, peas, or lentils. Unsalted nuts, nut butters, and seeds. Unsalted canned beans. Lean cuts of beef with fat trimmed off. Low-sodium, lean precooked or cured meat, such as sausages or meat loaves. Dairy Low-fat (1%) or fat-free (skim) milk. Reduced-fat, low-fat, or fat-free cheeses. Nonfat, low-sodium ricotta or cottage cheese. Low-fat or nonfat yogurt. Low-fat, low-sodium cheese. Fats and oils Soft margarine without trans fats. Vegetable oil. Reduced-fat, low-fat, or light mayonnaise and salad  dressings (reduced-sodium). Canola, safflower, olive, avocado, soybean, and sunflower oils. Avocado. Seasonings and condiments Herbs. Spices. Seasoning mixes without salt. Other foods Unsalted popcorn and pretzels. Fat-free sweets. The items listed above may not be all the foods and drinks you can have. Talk to a dietitian to learn more. What foods should I avoid? Fruits Canned fruit in a light or heavy syrup. Fried fruit. Fruit in cream or butter sauce. Vegetables Creamed or fried vegetables. Vegetables in a cheese sauce. Regular canned vegetables that are not marked as low-sodium or reduced-sodium. Regular canned tomato sauce and paste that are not marked as low-sodium or reduced-sodium. Regular tomato and vegetable juices that are not marked as low-sodium or reduced-sodium. Pickles. Olives. Grains Baked goods made with fat, such as croissants, muffins, or some breads. Dry pasta or rice meal packs. Meats and other proteins Fatty cuts of meat. Ribs. Fried meat. Bacon. Bologna, salami, and other precooked or cured meats, such as sausages or meat loaves, that are not lean and low in sodium. Fat from the back of a pig (fatback). Bratwurst. Salted nuts and seeds. Canned beans with added salt. Canned   or smoked fish. Whole eggs or egg yolks. Chicken or turkey with skin. Dairy Whole or 2% milk, cream, and half-and-half. Whole or full-fat cream cheese. Whole-fat or sweetened yogurt. Full-fat cheese. Nondairy creamers. Whipped toppings. Processed cheese and cheese spreads. Fats and oils Butter. Stick margarine. Lard. Shortening. Ghee. Bacon fat. Tropical oils, such as coconut, palm kernel, or palm oil. Seasonings and condiments Onion salt, garlic salt, seasoned salt, table salt, and sea salt. Worcestershire sauce. Tartar sauce. Barbecue sauce. Teriyaki sauce. Soy sauce, including reduced-sodium soy sauce. Steak sauce. Canned and packaged gravies. Fish sauce. Oyster sauce. Cocktail sauce. Store-bought  horseradish. Ketchup. Mustard. Meat flavorings and tenderizers. Bouillon cubes. Hot sauces. Pre-made or packaged marinades. Pre-made or packaged taco seasonings. Relishes. Regular salad dressings. Other foods Salted popcorn and pretzels. The items listed above may not be all the foods and drinks you should avoid. Talk to a dietitian to learn more. Where to find more information National Heart, Lung, and Blood Institute (NHLBI): nhlbi.nih.gov American Heart Association (AHA): heart.org Academy of Nutrition and Dietetics: eatright.org National Kidney Foundation (NKF): kidney.org This information is not intended to replace advice given to you by your health care provider. Make sure you discuss any questions you have with your health care provider. Document Revised: 08/19/2022 Document Reviewed: 08/19/2022 Elsevier Patient Education  2024 [employer].  

## 2023-06-15 ENCOUNTER — Encounter: Payer: Self-pay | Admitting: Nurse Practitioner

## 2023-06-15 ENCOUNTER — Ambulatory Visit (INDEPENDENT_AMBULATORY_CARE_PROVIDER_SITE_OTHER): Payer: Medicare Other | Admitting: Nurse Practitioner

## 2023-06-15 VITALS — BP 106/62 | HR 61 | Temp 97.6°F | Ht 66.0 in | Wt 201.0 lb

## 2023-06-15 DIAGNOSIS — Z23 Encounter for immunization: Secondary | ICD-10-CM

## 2023-06-15 DIAGNOSIS — N1831 Chronic kidney disease, stage 3a: Secondary | ICD-10-CM

## 2023-06-15 DIAGNOSIS — E785 Hyperlipidemia, unspecified: Secondary | ICD-10-CM

## 2023-06-15 DIAGNOSIS — Z0000 Encounter for general adult medical examination without abnormal findings: Secondary | ICD-10-CM | POA: Diagnosis not present

## 2023-06-15 DIAGNOSIS — E1169 Type 2 diabetes mellitus with other specified complication: Secondary | ICD-10-CM | POA: Diagnosis not present

## 2023-06-15 DIAGNOSIS — E669 Obesity, unspecified: Secondary | ICD-10-CM | POA: Diagnosis not present

## 2023-06-15 DIAGNOSIS — E1129 Type 2 diabetes mellitus with other diabetic kidney complication: Secondary | ICD-10-CM | POA: Diagnosis not present

## 2023-06-15 DIAGNOSIS — I152 Hypertension secondary to endocrine disorders: Secondary | ICD-10-CM | POA: Diagnosis not present

## 2023-06-15 DIAGNOSIS — R809 Proteinuria, unspecified: Secondary | ICD-10-CM | POA: Diagnosis not present

## 2023-06-15 DIAGNOSIS — E66811 Obesity, class 1: Secondary | ICD-10-CM

## 2023-06-15 DIAGNOSIS — I700 Atherosclerosis of aorta: Secondary | ICD-10-CM | POA: Diagnosis not present

## 2023-06-15 DIAGNOSIS — E1159 Type 2 diabetes mellitus with other circulatory complications: Secondary | ICD-10-CM | POA: Diagnosis not present

## 2023-06-15 DIAGNOSIS — E559 Vitamin D deficiency, unspecified: Secondary | ICD-10-CM

## 2023-06-15 LAB — BAYER DCA HB A1C WAIVED: HB A1C (BAYER DCA - WAIVED): 6.4 % — ABNORMAL HIGH (ref 4.8–5.6)

## 2023-06-15 MED ORDER — METOPROLOL SUCCINATE ER 50 MG PO TB24: ORAL_TABLET | ORAL | 4 refills | Status: DC

## 2023-06-15 MED ORDER — CHLORTHALIDONE 25 MG PO TABS: 25.0000 mg | ORAL_TABLET | Freq: Every day | ORAL | 4 refills | Status: DC

## 2023-06-15 NOTE — Assessment & Plan Note (Signed)
Chronic, ongoing.  Diet-controlled.  A1c 6.4% today. Urine ALB 80 April 2024.  Check CMP today.   Could consider Marcelline Deist in future and stopping Chlorthalidone -- to allow diuretic affect maintained but extra kidney and diabetes protection. Consider nephrology referral if worsening CKD.

## 2023-06-15 NOTE — Assessment & Plan Note (Signed)
Chronic, stable.  Noted on imaging 11/21/2017.  Educated patient on this finding.  Recommend continue statin and ASA daily for prevention.

## 2023-06-15 NOTE — Assessment & Plan Note (Signed)
Ongoing, stable.  Current kidney failure risk calculation based on last labs is 2.07% in 5 years.  Educated her on this calculation, it is less then 5% which is beneficial.  Continue Olmesartan for kidney protection with her proteinuria and renal dose medications as needed.  Avoid NSAIDs and contrast unless needed.  CMP today.  Could consider Marcelline Deist in future and stopping Chlorthalidone -- to allow diuretic affect maintained but extra kidney and diabetes protection CKD.  Urine ALB 80 April 2024.

## 2023-06-15 NOTE — Assessment & Plan Note (Signed)
With CKD, recheck level today and continue supplement. 

## 2023-06-15 NOTE — Assessment & Plan Note (Signed)
Chronic, ongoing.  Diet-controlled.  A1c 6.4% today.  Urine ALB 80 April 2024.  Check CMP today.   Could consider Marcelline Deist in future and stopping Chlorthalidone -- to allow diuretic affect maintained but extra kidney and diabetes protection. Consider nephrology referral if worsening CKD.  Refer to CKD plan for further.

## 2023-06-15 NOTE — Progress Notes (Signed)
BP 106/62 (BP Location: Left Arm, Patient Position: Sitting, Cuff Size: Large)   Pulse 61   Temp 97.6 F (36.4 C) (Oral)   Ht 5\' 6"  (1.676 m)   Wt 201 lb (91.2 kg)   LMP  (LMP Unknown)   SpO2 97%   BMI 32.44 kg/m    Subjective:    Patient ID: Denise Morris, female    DOB: 1946-06-05, 77 y.o.   MRN: [id number]  HPI: Denise Morris is a 77 y.o. female presenting on 06/15/2023 for comprehensive medical examination. Current medical complaints include:none  She currently lives with: self Menopausal Symptoms: no   DIABETES A1c initially elevated at 6.6% in August 2021.  Last A1c 6.6% April.  Diet controlled at this time.  Has lost 8 lbs since April, focusing on diet changes. Hypoglycemic episodes:no Polydipsia/polyuria: no Visual disturbance: no Chest pain: no Paresthesias: no Glucose Monitoring: no  Accucheck frequency: occasional  Fasting glucose: 93 to 112  Post prandial:  Evening:  Before meals: Taking Insulin?: no  Long acting insulin:  Short acting insulin: Blood Pressure Monitoring: weekly Retinal Examination: Up to Date -- Dr. Larence Penning, months back, sent fax Foot Exam: Up to Date Pneumovax: Up to Date Influenza: will obtain at [facility] Aspirin: yes   HYPERTENSION / HYPERLIPIDEMIA Taking Olmesartan 40 MG, Metoprolol XL 50 MG, Hygroton 25 MG, and ASA + Crestor for HLD.  Follows with Dr. Welton Flakes, cardiology, follows with every 4-5 months.  Last echo May 2020 == EF 60% noted in chart, has had one since then, but no records available for this.  Aortic atherosclerosis on imaging, CXR in 2019. Satisfied with current treatment? yes Duration of hypertension: chronic BP monitoring frequency: occasionally BP range: averages <130/80 BP medication side effects: no Duration of hyperlipidemia: chronic Cholesterol medication side effects: no Cholesterol supplements: none Medication compliance: good compliance Aspirin: yes Recent stressors: no Recurrent headaches: no Visual changes:  no Palpitations: occasional, follows with cardiology Dyspnea: no Chest pain: no Lower extremity edema: no Dizzy/lightheaded: no   CHRONIC KIDNEY DISEASE (Stage 3b) CKD status: stable Medications renally dose: yes Previous renal evaluation: yes Pneumovax:  Up to Date Influenza Vaccine:  Up to Date    Depression Screen done today and results listed below:     06/15/2023   10:06 AM 12/10/2022   10:32 AM 12/06/2022   10:32 AM 06/10/2022   10:15 AM 12/09/2021   11:04 AM  Depression screen PHQ 2/9  Decreased Interest 0 0 0 0 0  Down, Depressed, Hopeless 0 0 0 0 0  PHQ - 2 Score 0 0 0 0 0  Altered sleeping 0 1 0 1 1  Tired, decreased energy 0 0 0 0 0  Change in appetite 0 0 0 0 0  Feeling bad or failure about yourself  0 0 0 0 0  Trouble concentrating 0 0 0 0 0  Moving slowly or fidgety/restless 0 0 0 0 0  Suicidal thoughts 0 0 0 0 0  PHQ-9 Score 0 1 0 1 1  Difficult doing work/chores  Not difficult at all Not difficult at all Not difficult at all       06/15/2023   10:05 AM 12/10/2022   10:32 AM 06/10/2022   10:15 AM 12/09/2021   11:04 AM  GAD 7 : Generalized Anxiety Score  Nervous, Anxious, on Edge 0 0 0 0  Control/stop worrying 0 0 0 0  Worry too much - different things 0 0 0 0  Trouble relaxing  0 0 0 0  Restless 0 0 0 0  Easily annoyed or irritable 0 0 0 0  Afraid - awful might happen 0 0 0 0  Total GAD 7 Score 0 0 0 0  Anxiety Difficulty  Not difficult at all Not difficult at all Not difficult at all      06/15/2023   10:30 AM 12/10/2022   10:32 AM 12/06/2022   10:34 AM 12/05/2022    1:13 PM 06/10/2022   10:15 AM  Fall Risk   Falls in the past year? 0 0 0 0 0  Number falls in past yr: 0 0 0  0  Injury with Fall? 0 0 0  0  Risk for fall due to : No Fall Risks No Fall Risks No Fall Risks  No Fall Risks  Follow up Education provided Falls evaluation completed Falls prevention discussed;Falls evaluation completed  Falls evaluation completed    Functional Status  Survey: Is the patient deaf or have difficulty hearing?: No Does the patient have difficulty seeing, even when wearing glasses/contacts?: No Does the patient have difficulty concentrating, remembering, or making decisions?: No Does the patient have difficulty walking or climbing stairs?: No Does the patient have difficulty dressing or bathing?: No Does the patient have difficulty doing errands alone such as visiting a doctor's office or shopping?: No    Past Medical History:  Past Medical History:  Diagnosis Date   Anxiety    Depression    Edema    Heart palpitations    Hyperlipidemia    Hypertension    Menopausal state    Osteopenia     Surgical History:  Past Surgical History:  Procedure Laterality Date   ABDOMINAL HYSTERECTOMY  1978   Partial ( Precancerous cells)    Medications:  Current Outpatient Medications on File Prior to Visit  Medication Sig   aspirin 81 MG tablet Take 81 mg by mouth daily.   Blood Glucose Monitoring Suppl (ONETOUCH VERIO) w/Device KIT Use to check blood sugar 3 times a day and document results, bring to appointments.  Goal is <130 fasting blood sugar and <180 two hours after meals.   cholecalciferol (VITAMIN D3) 25 MCG (1000 UNIT) tablet Take 1,000 Units by mouth daily.   glucose blood (ONETOUCH VERIO) test strip Use to check blood sugar 3 times a day and document results, bring to appointments.  Goal is <130 fasting blood sugar and <180 two hours after meals.   hydrocortisone 2.5 % cream APPLY TOPICALLY 2 TIMES DAILY AS NEEDED.   nitroGLYCERIN (NITROSTAT) 0.3 MG SL tablet Place 1 tablet (0.3 mg total) under the tongue every 5 (five) minutes as needed.   olmesartan (BENICAR) 40 MG tablet TAKE 1 TABLET BY MOUTH EVERY DAY   OneTouch UltraSoft 2 Lancets MISC 1 applicator by Does not apply route in the morning and at bedtime. Use to check blood sugar 3 times a day and document results, bring to appointments.  Goal is <130 fasting blood sugar and <180  two hours after meals.   rosuvastatin (CRESTOR) 20 MG tablet Take 20 mg by mouth daily.   No current facility-administered medications on file prior to visit.    Allergies:  Allergies  Allergen Reactions   Citalopram Hydrobromide Nausea And Vomiting   Penicillins    Zestril [Lisinopril] Cough    Social History:  Social History   Socioeconomic History   Marital status: Widowed    Spouse name: Not on file   Number of children: Not  on file   Years of education: Not on file   Highest education level: Master's degree (e.g., MA, MS, MEng, MEd, MSW, MBA)  Occupational History   Occupation: retired  Tobacco Use   Smoking status: Never   Smokeless tobacco: Never  Vaping Use   Vaping status: Never Used  Substance and Sexual Activity   Alcohol use: No   Drug use: No   Sexual activity: Yes  Other Topics Concern   Not on file  Social History Narrative   [employer]- meet monthly   [employer]- meet monthly   Social Determinants of Health   Financial Resource Strain: Low Risk  (12/06/2022)   Overall Financial Resource Strain (CARDIA)    Difficulty of Paying Living Expenses: Not hard at all  Food Insecurity: No Food Insecurity (12/06/2022)   Hunger Vital Sign    Worried About Running Out of Food in the Last Year: Never true    Ran Out of Food in the Last Year: Never true  Transportation Needs: No Transportation Needs (12/06/2022)   PRAPARE - Administrator, Civil Service (Medical): No    Lack of Transportation (Non-Medical): No  Physical Activity: Insufficiently Active (12/06/2022)   Exercise Vital Sign    Days of Exercise per Week: 2 days    Minutes of Exercise per Session: 20 min  Stress: No Stress Concern Present (12/06/2022)   [employer] of Occupational Health - Occupational Stress Questionnaire    Feeling of Stress : Not at all  Social Connections: Moderately Integrated (12/06/2022)   Social Connection and Isolation Panel [NHANES]    Frequency of Communication with  Friends and Family: More than three times a week    Frequency of Social Gatherings with Friends and Family: More than three times a week    Attends Religious Services: More than 4 times per year    Active Member of [employer] or Organizations: Yes    Attends Banker Meetings: More than 4 times per year    Marital Status: Widowed  Intimate Partner Violence: Not At Risk (12/06/2022)   Humiliation, Afraid, Rape, and Kick questionnaire    Fear of Current or Ex-Partner: No    Emotionally Abused: No    Physically Abused: No    Sexually Abused: No   Social History   Tobacco Use  Smoking Status Never  Smokeless Tobacco Never   Social History   Substance and Sexual Activity  Alcohol Use No    Family History:  Family History  Problem Relation Age of Onset   Alzheimer's disease Mother    Hypertension Father    Hypertension Sister    Hyperlipidemia Sister    Breast cancer Neg Hx     Past medical history, surgical history, medications, allergies, family history and social history reviewed with patient today and changes made to appropriate areas of the chart.   Review of Systems - negative All other ROS negative except what is listed above and in the HPI.      Objective:    BP 106/62 (BP Location: Left Arm, Patient Position: Sitting, Cuff Size: Large)   Pulse 61   Temp 97.6 F (36.4 C) (Oral)   Ht 5\' 6"  (1.676 m)   Wt 201 lb (91.2 kg)   LMP  (LMP Unknown)   SpO2 97%   BMI 32.44 kg/m   Wt Readings from Last 3 Encounters:  06/15/23 201 lb (91.2 kg)  02/28/23 206 lb (93.4 kg)  12/10/22 209 lb 8 oz (95 kg)  Physical Exam Vitals and nursing note reviewed.  Constitutional:      General: She is awake. She is not in acute distress.    Appearance: She is well-developed. She is not ill-appearing.  HENT:     Head: Normocephalic and atraumatic.     Right Ear: Hearing, tympanic membrane, ear canal and external ear normal. No drainage.     Left Ear: Hearing, tympanic  membrane, ear canal and external ear normal. No drainage.     Nose: Nose normal.     Right Sinus: No maxillary sinus tenderness or frontal sinus tenderness.     Left Sinus: No maxillary sinus tenderness or frontal sinus tenderness.     Mouth/Throat:     Mouth: Mucous membranes are moist.     Pharynx: Oropharynx is clear. Uvula midline. No pharyngeal swelling, oropharyngeal exudate or posterior oropharyngeal erythema.  Eyes:     General: Lids are normal.        Right eye: No discharge.        Left eye: No discharge.     Extraocular Movements: Extraocular movements intact.     Conjunctiva/sclera: Conjunctivae normal.     Pupils: Pupils are equal, round, and reactive to light.     Visual Fields: Right eye visual fields normal and left eye visual fields normal.  Neck:     Thyroid: No thyromegaly.     Vascular: No carotid bruit.     Trachea: Trachea normal.  Cardiovascular:     Rate and Rhythm: Normal rate and regular rhythm.     Heart sounds: Normal heart sounds. No murmur heard.    No gallop.  Pulmonary:     Effort: Pulmonary effort is normal. No accessory muscle usage or respiratory distress.     Breath sounds: Normal breath sounds.  Chest:  Breasts:    Right: Normal.     Left: Normal.  Abdominal:     General: Bowel sounds are normal.     Palpations: Abdomen is soft. There is no hepatomegaly or splenomegaly.     Tenderness: There is no abdominal tenderness.  Musculoskeletal:        General: Normal range of motion.     Cervical back: Normal range of motion and neck supple.     Right lower leg: No edema.     Left lower leg: No edema.  Lymphadenopathy:     Head:     Right side of head: No submental, submandibular, tonsillar, preauricular or posterior auricular adenopathy.     Left side of head: No submental, submandibular, tonsillar, preauricular or posterior auricular adenopathy.     Cervical: No cervical adenopathy.     Upper Body:     Right upper body: No supraclavicular,  axillary or pectoral adenopathy.     Left upper body: No supraclavicular, axillary or pectoral adenopathy.  Skin:    General: Skin is warm and dry.     Capillary Refill: Capillary refill takes less than 2 seconds.     Findings: No rash.  Neurological:     Mental Status: She is alert and oriented to person, place, and time.     Gait: Gait is intact.     Deep Tendon Reflexes: Reflexes are normal and symmetric.     Reflex Scores:      Brachioradialis reflexes are 2+ on the right side and 2+ on the left side.      Patellar reflexes are 2+ on the right side and 2+ on the left side. Psychiatric:  Attention and Perception: Attention normal.        Mood and Affect: Mood normal.        Speech: Speech normal.        Behavior: Behavior normal. Behavior is cooperative.        Thought Content: Thought content normal.        Judgment: Judgment normal.    Results for orders placed or performed in visit on 12/23/22  HM DIABETES EYE EXAM  Result Value Ref Range   HM Diabetic Eye Exam No Retinopathy No Retinopathy      Assessment & Plan:   Problem List Items Addressed This Visit       Cardiovascular and Mediastinum   Aortic atherosclerosis (HCC)    Chronic, stable.  Noted on imaging 11/21/2017.  Educated patient on this finding.  Recommend continue statin and ASA daily for prevention.      Relevant Medications   metoprolol succinate (TOPROL-XL) 50 MG 24 hr tablet   chlorthalidone (HYGROTON) 25 MG tablet   Other Relevant Orders   Comprehensive metabolic panel   Lipid Panel w/o Chol/HDL Ratio   Hypertension associated with diabetes (HCC)    Chronic, stable.  BP well below goal.  Continue current medication regimen and collaboration with cardiology.  Continue to monitor BP at home regularly and focus on DASH diet.  LABS: CBC, CMP, TSH.  Could consider Marcelline Deist in future and stopping Chlorthalidone -- to allow diuretic affect maintained but extra kidney and diabetes protection. Urine ALB 80  April 2024.  Return in 6 months for follow-up.      Relevant Medications   metoprolol succinate (TOPROL-XL) 50 MG 24 hr tablet   chlorthalidone (HYGROTON) 25 MG tablet   Other Relevant Orders   Bayer DCA Hb A1c Waived   CBC with Differential/Platelet   Comprehensive metabolic panel   TSH     Endocrine   Hyperlipidemia associated with type 2 diabetes mellitus (HCC)    Chronic, ongoing.  Continue current medication regimen and adjust as needed.  Lipid panel today.      Relevant Medications   metoprolol succinate (TOPROL-XL) 50 MG 24 hr tablet   chlorthalidone (HYGROTON) 25 MG tablet   Other Relevant Orders   Bayer DCA Hb A1c Waived   Comprehensive metabolic panel   Lipid Panel w/o Chol/HDL Ratio   Type 2 diabetes mellitus with obesity (HCC)    Chronic, ongoing.  Diet-controlled.  A1c 6.4% today. Urine ALB 80 April 2024.  Check CMP today.   Could consider Marcelline Deist in future and stopping Chlorthalidone -- to allow diuretic affect maintained but extra kidney and diabetes protection. Consider nephrology referral if worsening CKD.      Relevant Orders   Bayer DCA Hb A1c Waived   Type 2 diabetes mellitus with proteinuria (HCC) - Primary    Chronic, ongoing.  Diet-controlled.  A1c 6.4% today.  Urine ALB 80 April 2024.  Check CMP today.   Could consider Marcelline Deist in future and stopping Chlorthalidone -- to allow diuretic affect maintained but extra kidney and diabetes protection. Consider nephrology referral if worsening CKD.  Refer to CKD plan for further.      Relevant Orders   Bayer DCA Hb A1c Waived     Genitourinary   CKD (chronic kidney disease) stage 3, GFR 30-59 ml/min (HCC)    Ongoing, stable.  Current kidney failure risk calculation based on last labs is 2.07% in 5 years.  Educated her on this calculation, it is less then 5% which  is beneficial.  Continue Olmesartan for kidney protection with her proteinuria and renal dose medications as needed.  Avoid NSAIDs and contrast unless  needed.  CMP today.  Could consider Marcelline Deist in future and stopping Chlorthalidone -- to allow diuretic affect maintained but extra kidney and diabetes protection CKD.  Urine ALB 80 April 2024.      Relevant Orders   Comprehensive metabolic panel     Other   Obesity    BMI 32.44.  Recommended eating smaller high protein, low fat meals more frequently and exercising 30 mins a day 5 times a week with a goal of 10-15lb weight loss in the next 3 months. Patient voiced their understanding and motivation to adhere to these recommendations.       Vitamin D deficiency    With CKD, recheck level today and continue supplement.      Relevant Orders   VITAMIN D 25 Hydroxy (Vit-D Deficiency, Fractures)   Other Visit Diagnoses     Healthy adult on routine physical examination       All labs drawn today and health maintenance reviewed.        Follow up plan: Return in about 6 months (around 12/14/2023) for T2DM, HTN/HLD, CKD.   LABORATORY TESTING:  - Pap smear: not applicable  IMMUNIZATIONS:   - Tetanus vaccination status reviewed: will wait until next visit - Influenza: Up to date -- [facility] - Pneumovax: Up to date - Prevnar: Up to date - HPV: Not applicable - Zostavax vaccine: Up to date - COVID: Up To Date  SCREENING: -Mammogram: Up to date  - Colonoscopy: Not applicable - Bone Density: Up to date  -Hearing Test: Not applicable  -Spirometry: Not applicable   PATIENT COUNSELING:   Advised to take 1 mg of folate supplement per day if capable of pregnancy.   Sexuality: Discussed sexually transmitted diseases, partner selection, use of condoms, avoidance of unintended pregnancy  and contraceptive alternatives.   Advised to avoid cigarette smoking.  I discussed with the patient that most people either abstain from alcohol or drink within safe limits (<=14/week and <=4 drinks/occasion for males, <=7/weeks and <= 3 drinks/occasion for females) and that the risk for alcohol  disorders and other health effects rises proportionally with the number of drinks per week and how often a drinker exceeds daily limits.  Discussed cessation/primary prevention of drug use and availability of treatment for abuse.   Diet: Encouraged to adjust caloric intake to maintain  or achieve ideal body weight, to reduce intake of dietary saturated fat and total fat, to limit sodium intake by avoiding high sodium foods and not adding table salt, and to maintain adequate dietary potassium and calcium preferably from fresh fruits, vegetables, and low-fat dairy products.    Stressed the importance of regular exercise  Injury prevention: Discussed safety belts, safety helmets, smoke detector, smoking near bedding or upholstery.   Dental health: Discussed importance of regular tooth brushing, flossing, and dental visits.    NEXT PREVENTATIVE PHYSICAL DUE IN 1 YEAR. Return in about 6 months (around 12/14/2023) for T2DM, HTN/HLD, CKD.

## 2023-06-15 NOTE — Assessment & Plan Note (Signed)
Chronic, stable.  BP well below goal.  Continue current medication regimen and collaboration with cardiology.  Continue to monitor BP at home regularly and focus on DASH diet.  LABS: CBC, CMP, TSH.  Could consider Marcelline Deist in future and stopping Chlorthalidone -- to allow diuretic affect maintained but extra kidney and diabetes protection. Urine ALB 80 April 2024.  Return in 6 months for follow-up.

## 2023-06-15 NOTE — Assessment & Plan Note (Signed)
Chronic, ongoing.  Continue current medication regimen and adjust as needed. Lipid panel today. 

## 2023-06-15 NOTE — Assessment & Plan Note (Signed)
BMI 32.44.  Recommended eating smaller high protein, low fat meals more frequently and exercising 30 mins a day 5 times a week with a goal of 10-15lb weight loss in the next 3 months. Patient voiced their understanding and motivation to adhere to these recommendations.

## 2023-06-16 LAB — LIPID PANEL W/O CHOL/HDL RATIO
Cholesterol, Total: 134 mg/dL (ref 100–199)
HDL: 53 mg/dL (ref >39)
LDL Chol Calc (NIH): 61 mg/dL (ref 0–99)
Triglycerides: 109 mg/dL (ref 0–149)
VLDL Cholesterol Cal: 20 mg/dL (ref 5–40)

## 2023-06-16 LAB — COMPREHENSIVE METABOLIC PANEL
ALT: 16 [IU]/L (ref 0–32)
AST: 22 [IU]/L (ref 0–40)
Albumin: 3.9 g/dL (ref 3.8–4.8)
Alkaline Phosphatase: 90 [IU]/L (ref 44–121)
BUN/Creatinine Ratio: 23 (ref 12–28)
BUN: 25 mg/dL (ref 8–27)
Bilirubin Total: 0.2 mg/dL (ref 0.0–1.2)
CO2: 23 mmol/L (ref 20–29)
Calcium: 8.9 mg/dL (ref 8.7–10.3)
Chloride: 103 mmol/L (ref 96–106)
Creatinine, Ser: 1.09 mg/dL — ABNORMAL HIGH (ref 0.57–1.00)
Globulin, Total: 2.6 g/dL (ref 1.5–4.5)
Glucose: 107 mg/dL — ABNORMAL HIGH (ref 70–99)
Potassium: 3.9 mmol/L (ref 3.5–5.2)
Sodium: 140 mmol/L (ref 134–144)
Total Protein: 6.5 g/dL (ref 6.0–8.5)
eGFR: 52 mL/min/{1.73_m2} — ABNORMAL LOW (ref >59)

## 2023-06-16 LAB — CBC WITH DIFFERENTIAL/PLATELET
Basophils Absolute: 0.1 10*3/uL (ref 0.0–0.2)
Basos: 1 %
EOS (ABSOLUTE): 0.3 10*3/uL (ref 0.0–0.4)
Eos: 6 %
Hematocrit: 42.7 % (ref 34.0–46.6)
Hemoglobin: 13.6 g/dL (ref 11.1–15.9)
Immature Grans (Abs): 0 10*3/uL (ref 0.0–0.1)
Immature Granulocytes: 0 %
Lymphocytes Absolute: 1.9 10*3/uL (ref 0.7–3.1)
Lymphs: 38 %
MCH: 29.8 pg (ref 26.6–33.0)
MCHC: 31.9 g/dL (ref 31.5–35.7)
MCV: 94 fL (ref 79–97)
Monocytes Absolute: 0.4 10*3/uL (ref 0.1–0.9)
Monocytes: 9 %
Neutrophils Absolute: 2.3 10*3/uL (ref 1.4–7.0)
Neutrophils: 46 %
Platelets: 142 10*3/uL — ABNORMAL LOW (ref 150–450)
RBC: 4.56 x10E6/uL (ref 3.77–5.28)
RDW: 13 % (ref 11.7–15.4)
WBC: 5.1 10*3/uL (ref 3.4–10.8)

## 2023-06-16 LAB — VITAMIN D 25 HYDROXY (VIT D DEFICIENCY, FRACTURES): Vit D, 25-Hydroxy: 41.8 ng/mL (ref 30.0–100.0)

## 2023-06-16 LAB — TSH: TSH: 1.1 u[IU]/mL (ref 0.450–4.500)

## 2023-06-16 NOTE — Progress Notes (Signed)
Contacted via MyChart   Good evening Denise Morris, your labs have returned and overall are remaining baseline for you.  Platelets a little low, we will continue to monitor these.  Kidney function continue to show chronic kidney disease stage 3a with no worsening.  Remainder of labs all stable.  Any questions? Keep being amazing!!  Thank you for allowing me to participate in your care.  I appreciate you. Kindest regards, Maleny Candy

## 2023-07-01 ENCOUNTER — Ambulatory Visit (INDEPENDENT_AMBULATORY_CARE_PROVIDER_SITE_OTHER): Payer: Medicare Other | Admitting: Cardiovascular Disease

## 2023-07-01 ENCOUNTER — Encounter: Payer: Self-pay | Admitting: Cardiovascular Disease

## 2023-07-01 VITALS — BP 116/66 | HR 60 | Ht 66.0 in | Wt 202.4 lb

## 2023-07-01 DIAGNOSIS — R002 Palpitations: Secondary | ICD-10-CM

## 2023-07-01 DIAGNOSIS — E1159 Type 2 diabetes mellitus with other circulatory complications: Secondary | ICD-10-CM | POA: Diagnosis not present

## 2023-07-01 DIAGNOSIS — I152 Hypertension secondary to endocrine disorders: Secondary | ICD-10-CM

## 2023-07-01 DIAGNOSIS — E785 Hyperlipidemia, unspecified: Secondary | ICD-10-CM | POA: Diagnosis not present

## 2023-07-01 DIAGNOSIS — Q245 Malformation of coronary vessels: Secondary | ICD-10-CM | POA: Diagnosis not present

## 2023-07-01 DIAGNOSIS — E1169 Type 2 diabetes mellitus with other specified complication: Secondary | ICD-10-CM

## 2023-07-01 DIAGNOSIS — I700 Atherosclerosis of aorta: Secondary | ICD-10-CM | POA: Diagnosis not present

## 2023-07-01 NOTE — Progress Notes (Signed)
Cardiology Office Note   Date:  07/01/2023   ID:  Denise Morris, DOB 04-11-1946, MRN [id number]  PCP:  Marjie Skiff, NP  Cardiologist:  Adrian Blackwater, MD      History of Present Illness: Denise Morris is a 77 y.o. female who presents for  Chief Complaint  Patient presents with   Follow-up    4 month follow up    Took extra metoprolol due to palpitation, twice in oct and once in Nov      Past Medical History:  Diagnosis Date   Anxiety    Depression    Edema    Heart palpitations    Hyperlipidemia    Hypertension    Menopausal state    Osteopenia      Past Surgical History:  Procedure Laterality Date   ABDOMINAL HYSTERECTOMY  1978   Partial ( Precancerous cells)     Current Outpatient Medications  Medication Sig Dispense Refill   aspirin 81 MG tablet Take 81 mg by mouth daily.     chlorthalidone (HYGROTON) 25 MG tablet Take 1 tablet (25 mg total) by mouth daily. 90 tablet 4   glucose blood (ONETOUCH VERIO) test strip Use to check blood sugar 3 times a day and document results, bring to appointments.  Goal is <130 fasting blood sugar and <180 two hours after meals. 100 each 12   hydrocortisone 2.5 % cream APPLY TOPICALLY 2 TIMES DAILY AS NEEDED. 28.35 g 3   metoprolol succinate (TOPROL-XL) 50 MG 24 hr tablet TAKE 1 TABLET BY MOUTH EVERY DAY WITH OR IMMEDIATELY FOLLOWING A MEAL 90 tablet 4   olmesartan (BENICAR) 40 MG tablet TAKE 1 TABLET BY MOUTH EVERY DAY 90 tablet 2   OneTouch UltraSoft 2 Lancets MISC 1 applicator by Does not apply route in the morning and at bedtime. Use to check blood sugar 3 times a day and document results, bring to appointments.  Goal is <130 fasting blood sugar and <180 two hours after meals. 100 each 3   rosuvastatin (CRESTOR) 20 MG tablet Take 20 mg by mouth daily.     Blood Glucose Monitoring Suppl (ONETOUCH VERIO) w/Device KIT Use to check blood sugar 3 times a day and document results, bring to appointments.  Goal is <130  fasting blood sugar and <180 two hours after meals. (Patient not taking: Reported on 07/01/2023) 1 kit 0   cholecalciferol (VITAMIN D3) 25 MCG (1000 UNIT) tablet Take 1,000 Units by mouth daily. (Patient not taking: Reported on 07/01/2023)     nitroGLYCERIN (NITROSTAT) 0.3 MG SL tablet Place 1 tablet (0.3 mg total) under the tongue every 5 (five) minutes as needed. (Patient not taking: Reported on 07/01/2023) 90 tablet 1   No current facility-administered medications for this visit.    Allergies:   Citalopram hydrobromide, Penicillins, and Zestril [lisinopril]    Social History:   reports that she has never smoked. She has never used smokeless tobacco. She reports that she does not drink alcohol and does not use drugs.   Family History:  family history includes Alzheimer's disease in her mother; Hyperlipidemia in her sister; Hypertension in her father and sister.    ROS:     Review of Systems  Constitutional: Negative.   HENT: Negative.    Eyes: Negative.   Respiratory: Negative.    Gastrointestinal: Negative.   Genitourinary: Negative.   Musculoskeletal: Negative.   Skin: Negative.   Neurological: Negative.   Endo/Heme/Allergies: Negative.   Psychiatric/Behavioral: Negative.  All other systems reviewed and are negative.     All other systems are reviewed and negative.    PHYSICAL EXAM: VS:  BP 116/66   Pulse 60   Ht 5\' 6"  (1.676 m)   Wt 202 lb 6.4 oz (91.8 kg)   LMP  (LMP Unknown)   SpO2 97%   BMI 32.67 kg/m  , BMI Body mass index is 32.67 kg/m. Last weight:  Wt Readings from Last 3 Encounters:  07/01/23 202 lb 6.4 oz (91.8 kg)  06/15/23 201 lb (91.2 kg)  02/28/23 206 lb (93.4 kg)     Physical Exam Constitutional:      Appearance: Normal appearance.  Cardiovascular:     Rate and Rhythm: Normal rate and regular rhythm.     Heart sounds: Normal heart sounds.  Pulmonary:     Effort: Pulmonary effort is normal.     Breath sounds: Normal breath sounds.   Musculoskeletal:     Right lower leg: No edema.     Left lower leg: No edema.  Neurological:     Mental Status: She is alert.       EKG:   Recent Labs: 06/15/2023: ALT 16; BUN 25; Creatinine, Ser 1.09; Hemoglobin 13.6; Platelets 142; Potassium 3.9; Sodium 140; TSH 1.100    Lipid Panel    Component Value Date/Time   CHOL 134 06/15/2023 1014   CHOL 148 02/12/2015 1022   TRIG 109 06/15/2023 1014   TRIG 80 02/12/2015 1022   HDL 53 06/15/2023 1014   CHOLHDL 3.7 05/31/2018 1353   VLDL 16 02/12/2015 1022   LDLCALC 61 06/15/2023 1014      Other studies Reviewed: Additional studies/ records that were reviewed today include:  Review of the above records demonstrates:       No data to display            ASSESSMENT AND PLAN:    ICD-10-CM   1. 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 Aortic atherosclerosis (HCC)  I70.0     3. Hypertension associated with diabetes (HCC)  E11.59    I15.2     4. Hyperlipidemia associated with type 2 diabetes mellitus (HCC)  E11.69    E78.5     5. Palpitation  R00.2    Doesnot want to increase dosage metoprolol as infrequent and wants to continue extra dosage       Problem List Items Addressed This Visit       Cardiovascular and Mediastinum   Hypertension associated with diabetes (HCC)   Anomalous coronary artery origin - Primary   Aortic atherosclerosis (HCC)     Endocrine   Hyperlipidemia associated with type 2 diabetes mellitus (HCC)   Other Visit Diagnoses     Palpitation       Doesnot want to increase dosage metoprolol as infrequent and wants to continue extra dosage          Disposition:   Return in about 4 months (around 10/29/2023).    Total time spent: 30 minutes  Signed,  Adrian Blackwater, MD  07/01/2023 9:57 AM    [facility]

## 2023-08-28 ENCOUNTER — Other Ambulatory Visit: Payer: Self-pay | Admitting: Cardiovascular Disease

## 2023-08-28 DIAGNOSIS — E785 Hyperlipidemia, unspecified: Secondary | ICD-10-CM

## 2023-09-26 ENCOUNTER — Other Ambulatory Visit: Payer: Self-pay | Admitting: Nurse Practitioner

## 2023-09-26 DIAGNOSIS — Z1231 Encounter for screening mammogram for malignant neoplasm of breast: Secondary | ICD-10-CM

## 2023-10-11 ENCOUNTER — Ambulatory Visit
Admission: RE | Admit: 2023-10-11 | Discharge: 2023-10-11 | Disposition: A | Discharge status: Home / Self Care | Payer: Medicare Other | Source: Ambulatory Visit | Attending: Nurse Practitioner | Admitting: Nurse Practitioner

## 2023-10-11 ENCOUNTER — Encounter: Payer: Self-pay | Admitting: Radiology

## 2023-10-11 DIAGNOSIS — Z1231 Encounter for screening mammogram for malignant neoplasm of breast: Secondary | ICD-10-CM | POA: Insufficient documentation

## 2023-11-23 ENCOUNTER — Other Ambulatory Visit: Payer: Self-pay | Admitting: Cardiovascular Disease

## 2023-12-11 NOTE — Patient Instructions (Incomplete)
Be Involved in Caring For Your Health:  Taking Medications When medications are taken as directed, they can greatly improve your health. But if they are not taken as prescribed, they may not work. In some cases, not taking them correctly can be harmful. To help ensure your treatment remains effective and safe, understand your medications and how to take them. Bring your medications to each visit for review by your provider.  Your lab results, notes, and after visit summary will be available on My Chart. We strongly encourage you to use this feature. If lab results are abnormal the clinic will contact you with the appropriate steps. If the clinic does not contact you assume the results are satisfactory. You can always view your results on My Chart. If you have questions regarding your health or results, please contact the clinic during office hours. You can also ask questions on My Chart.  We at [facility] are grateful that you chose [place] to provide your care. We strive to provide evidence-based and compassionate care and are always looking for feedback. If you get a survey from the clinic please complete this so we can hear your opinions.  Diabetes Mellitus and Exercise Regular exercise is important for your health, especially if you have diabetes mellitus. Exercise is not just about losing weight. It can also help you increase muscle strength and bone density and reduce body fat and stress. This can help your level of endurance and make you more fit and flexible. Why should I exercise if I have diabetes? Exercise has many benefits for people with diabetes. It can: Help lower and control your blood sugar (glucose). Help your body respond better and become more sensitive to the hormone insulin. Reduce how much insulin your body needs. Lower your risk for heart disease by: Lowering how much "bad" cholesterol and triglycerides you have in your body. Increasing how much "good" cholesterol  you have in your body. Lowering your blood pressure. Lowering your blood glucose levels. What is my activity plan? Your health care provider or an expert trained in diabetes care (certified diabetes educator) can help you make an activity plan. This plan can help you find the type of exercise that works for you. It may also tell you how often to exercise and for how long. Be sure to: Get at least 150 minutes of medium-intensity or high-intensity exercise each week. This may involve brisk walking, biking, or water aerobics. Do stretching and strengthening exercises at least 2 times a week. This may involve yoga or weight lifting. Spread out your activity over at least 3 days of the week. Get some form of physical activity each day. Do not go more than 2 days in a row without some kind of activity. Avoid being inactive for more than 30 minutes at a time. Take frequent breaks to walk or stretch. Choose activities that you enjoy. Set goals that you know you can accomplish. Start slowly and increase the intensity of your exercise over time. How do I manage my diabetes during exercise?  Monitor your blood glucose Check your blood glucose before and after you exercise. If your blood glucose is 240 mg/dL (40.9 mmol/L) or higher before you exercise, check your urine for ketones. These are chemicals created by the liver. If you have ketones in your urine, do not exercise until your blood glucose returns to normal. If your blood glucose is 100 mg/dL (5.6 mmol/L) or lower, eat a snack that has 15-20 grams of carbohydrate in  it. Check your blood glucose 15 minutes after the snack to make sure that your level is above 100 mg/dL (5.6 mmol/L) before you start to exercise. Your risk for low blood glucose (hypoglycemia) goes up during and after exercise. Know the symptoms of this condition and how to treat it. Follow these instructions at home: Keep a carbohydrate snack on hand for use before, during, and after  exercise. This can help prevent or treat hypoglycemia. Avoid injecting insulin into parts of your body that are going to be used during exercise. This may include: Your arms, when you are going to play tennis. Your legs, when you are about to go jogging. Keep track of your exercise habits. This can help you and your health care provider watch and adjust your activity plan. Write down: What you eat before and after you exercise. Blood glucose levels before and after you exercise. The type and amount of exercise you do. Talk to your health care provider before you start a new activity. They may need to: Make sure that the activity is safe for you. Adjust your insulin, other medicines, and food that you eat. Drink water while you exercise. This can stop you from losing too much water (dehydration). It can also prevent problems caused by having a lot of heat in your body (heat stroke). Where to find more information American Diabetes Association: diabetes.org Association of Diabetes Care & Education Specialists: diabeteseducator.org This information is not intended to replace advice given to you by your health care provider. Make sure you discuss any questions you have with your health care provider. Document Revised: 01/20/2022 Document Reviewed: 01/20/2022 Elsevier Patient Education  2024 [employer].

## 2023-12-14 ENCOUNTER — Ambulatory Visit (INDEPENDENT_AMBULATORY_CARE_PROVIDER_SITE_OTHER): Payer: Self-pay | Admitting: Nurse Practitioner

## 2023-12-14 ENCOUNTER — Encounter: Payer: Self-pay | Admitting: Nurse Practitioner

## 2023-12-14 VITALS — BP 108/62 | HR 66 | Temp 98.7°F | Ht 66.0 in | Wt 201.6 lb

## 2023-12-14 DIAGNOSIS — E785 Hyperlipidemia, unspecified: Secondary | ICD-10-CM

## 2023-12-14 DIAGNOSIS — E66811 Obesity, class 1: Secondary | ICD-10-CM | POA: Diagnosis not present

## 2023-12-14 DIAGNOSIS — E1159 Type 2 diabetes mellitus with other circulatory complications: Secondary | ICD-10-CM | POA: Diagnosis not present

## 2023-12-14 DIAGNOSIS — R809 Proteinuria, unspecified: Secondary | ICD-10-CM | POA: Diagnosis not present

## 2023-12-14 DIAGNOSIS — E6609 Other obesity due to excess calories: Secondary | ICD-10-CM | POA: Diagnosis not present

## 2023-12-14 DIAGNOSIS — E669 Obesity, unspecified: Secondary | ICD-10-CM | POA: Diagnosis not present

## 2023-12-14 DIAGNOSIS — E1129 Type 2 diabetes mellitus with other diabetic kidney complication: Secondary | ICD-10-CM

## 2023-12-14 DIAGNOSIS — E1169 Type 2 diabetes mellitus with other specified complication: Secondary | ICD-10-CM | POA: Diagnosis not present

## 2023-12-14 DIAGNOSIS — Z6832 Body mass index (BMI) 32.0-32.9, adult: Secondary | ICD-10-CM

## 2023-12-14 DIAGNOSIS — I152 Hypertension secondary to endocrine disorders: Secondary | ICD-10-CM

## 2023-12-14 DIAGNOSIS — N1831 Chronic kidney disease, stage 3a: Secondary | ICD-10-CM | POA: Diagnosis not present

## 2023-12-14 DIAGNOSIS — I700 Atherosclerosis of aorta: Secondary | ICD-10-CM

## 2023-12-14 LAB — MICROALBUMIN, URINE WAIVED
Creatinine, Urine Waived: 200 mg/dL (ref 10–300)
Microalb, Ur Waived: 80 mg/L — ABNORMAL HIGH (ref 0–19)

## 2023-12-14 LAB — BAYER DCA HB A1C WAIVED: HB A1C (BAYER DCA - WAIVED): 6.2 % — ABNORMAL HIGH (ref 4.8–5.6)

## 2023-12-14 NOTE — Progress Notes (Signed)
 BP 108/62 (BP Location: Left Arm, Patient Position: Sitting)   Pulse 66   Temp 98.7 F (37.1 C) (Oral)   Ht 5\' 6"  (1.676 m)   Wt 201 lb 9.6 oz (91.4 kg)   LMP  (LMP Unknown)   SpO2 95%   BMI 32.54 kg/m    Subjective:    Patient ID: Denise Morris, female    DOB: 07-Apr-1946, 78 y.o.   MRN: [id number]  HPI: Denise Morris is a 78 y.o. female  Chief Complaint  Patient presents with   Chronic Kidney Disease   Diabetes    Eye exam requested from Dr. Rosan Comfort   Hyperlipidemia   Hypertension   DIABETES A1c initially elevated at 6.6% in August 2021. October A1c 6.4%  Diet controlled at this time.  Has lost 8 lbs since April, focusing on diet changes. Hypoglycemic episodes:no Polydipsia/polyuria: no Visual disturbance: no Chest pain: no Paresthesias: no Glucose Monitoring: no             Accucheck frequency: occasional             Fasting glucose: 89 to 113             Post prandial:             Evening:             Before meals: Taking Insulin?: no             Long acting insulin:             Short acting insulin: Blood Pressure Monitoring: weekly Retinal Examination: Up to Date -- Dr. Rosan Comfort, will get records Foot Exam: Up to Date Pneumovax: Up to Date Influenza: Up To Date Aspirin: yes    HYPERTENSION / HYPERLIPIDEMIA Continues Olmesartan 40 MG, Metoprolol  XL 50 MG, Hygroton  25 MG, and ASA + Crestor.  Follows with Dr. Meredeth Stallion for cardiology, last visit was 07/01/23 -- no changes made.  Last echo in chart October 2023 == EF 76% noted in chart.  Aortic atherosclerosis on imaging, CXR in 2019. Satisfied with current treatment? yes Duration of hypertension: chronic BP monitoring frequency: occasionally BP range: averages <130/80 BP medication side effects: no Duration of hyperlipidemia: chronic Cholesterol medication side effects: no Cholesterol supplements: none Medication compliance: good compliance Aspirin: yes Recent stressors: no Recurrent headaches: no Visual  changes: no Palpitations: once and awhile Dyspnea: no Chest pain: no Lower extremity edema: at baseline, more in the winter time Dizzy/lightheaded: occasional if bends over   CHRONIC KIDNEY DISEASE (Stage 3a) CKD status: stable Medications renally dose: yes Previous renal evaluation: yes Pneumovax:  Up to Date Influenza Vaccine:  Up to Date   Relevant past medical, surgical, family and social history reviewed and updated as indicated. Interim medical history since our last visit reviewed. Allergies and medications reviewed and updated.  Review of Systems  Constitutional:  Negative for activity change, appetite change, diaphoresis, fatigue and fever.  Respiratory:  Negative for cough, chest tightness and shortness of breath.   Cardiovascular:  Positive for leg swelling. Negative for chest pain and palpitations.  Gastrointestinal: Negative.   Neurological: Negative.   Psychiatric/Behavioral: Negative.     Per HPI unless specifically indicated above     Objective:    BP 108/62 (BP Location: Left Arm, Patient Position: Sitting)   Pulse 66   Temp 98.7 F (37.1 C) (Oral)   Ht 5\' 6"  (1.676 m)   Wt 201 lb 9.6 oz (91.4 kg)   LMP  (  LMP Unknown)   SpO2 95%   BMI 32.54 kg/m   Wt Readings from Last 3 Encounters:  12/14/23 201 lb 9.6 oz (91.4 kg)  07/01/23 202 lb 6.4 oz (91.8 kg)  06/15/23 201 lb (91.2 kg)    Physical Exam Vitals and nursing note reviewed.  Constitutional:      General: She is awake. She is not in acute distress.    Appearance: She is well-developed and well-groomed. She is obese. She is not ill-appearing.  HENT:     Head: Normocephalic.     Right Ear: Hearing normal.     Left Ear: Hearing normal.  Eyes:     General: Lids are normal.        Right eye: No discharge.        Left eye: No discharge.     Conjunctiva/sclera: Conjunctivae normal.     Pupils: Pupils are equal, round, and reactive to light.  Neck:     Thyroid : No thyromegaly.     Vascular: No  carotid bruit.  Cardiovascular:     Rate and Rhythm: Normal rate and regular rhythm.     Heart sounds: Normal heart sounds. No murmur heard.    No gallop.  Pulmonary:     Effort: Pulmonary effort is normal. No accessory muscle usage or respiratory distress.     Breath sounds: Normal breath sounds.  Abdominal:     General: Bowel sounds are normal.     Palpations: Abdomen is soft.  Musculoskeletal:     Cervical back: Normal range of motion and neck supple.     Right lower leg: 2+ Edema present.     Left lower leg: 2+ Edema present.  Skin:    General: Skin is warm and dry.  Neurological:     Mental Status: She is alert and oriented to person, place, and time.  Psychiatric:        Attention and Perception: Attention normal.        Mood and Affect: Mood normal.        Speech: Speech normal.        Behavior: Behavior normal. Behavior is cooperative.        Thought Content: Thought content normal.    Diabetic Foot Exam - Simple   Simple Foot Form Visual Inspection No deformities, no ulcerations, no other skin breakdown bilaterally: Yes Sensation Testing Intact to touch and monofilament testing bilaterally: Yes Pulse Check Posterior Tibialis and Dorsalis pulse intact bilaterally: Yes Comments     Results for orders placed or performed in visit on 06/15/23  Bayer DCA Hb A1c Waived   Collection Time: 06/15/23 10:13 AM  Result Value Ref Range   HB A1C (BAYER DCA - WAIVED) 6.4 (H) 4.8 - 5.6 %  CBC with Differential/Platelet   Collection Time: 06/15/23 10:14 AM  Result Value Ref Range   WBC 5.1 3.4 - 10.8 x10E3/uL   RBC 4.56 3.77 - 5.28 x10E6/uL   Hemoglobin 13.6 11.1 - 15.9 g/dL   Hematocrit 96.0 45.4 - 46.6 %   MCV 94 79 - 97 fL   MCH 29.8 26.6 - 33.0 pg   MCHC 31.9 31.5 - 35.7 g/dL   RDW 09.8 11.9 - 14.7 %   Platelets 142 (L) 150 - 450 x10E3/uL   Neutrophils 46 Not Estab. %   Lymphs 38 Not Estab. %   Monocytes 9 Not Estab. %   Eos 6 Not Estab. %   Basos 1 Not Estab. %    Neutrophils  Absolute 2.3 1.4 - 7.0 x10E3/uL   Lymphocytes Absolute 1.9 0.7 - 3.1 x10E3/uL   Monocytes Absolute 0.4 0.1 - 0.9 x10E3/uL   EOS (ABSOLUTE) 0.3 0.0 - 0.4 x10E3/uL   Basophils Absolute 0.1 0.0 - 0.2 x10E3/uL   Immature Granulocytes 0 Not Estab. %   Immature Grans (Abs) 0.0 0.0 - 0.1 x10E3/uL  Comprehensive metabolic panel   Collection Time: 06/15/23 10:14 AM  Result Value Ref Range   Glucose 107 (H) 70 - 99 mg/dL   BUN 25 8 - 27 mg/dL   Creatinine, Ser 2.20 (H) 0.57 - 1.00 mg/dL   eGFR 52 (L) >25 [place]/HCW/2.37   BUN/Creatinine Ratio 23 12 - 28   Sodium 140 134 - 144 mmol/L   Potassium 3.9 3.5 - 5.2 mmol/L   Chloride 103 96 - 106 mmol/L   CO2 23 20 - 29 mmol/L   Calcium 8.9 8.7 - 10.3 mg/dL   Total Protein 6.5 6.0 - 8.5 g/dL   Albumin 3.9 3.8 - 4.8 g/dL   Globulin, Total 2.6 1.5 - 4.5 g/dL   Bilirubin Total 0.2 0.0 - 1.2 mg/dL   Alkaline Phosphatase 90 44 - 121 IU/L   AST 22 0 - 40 IU/L   ALT 16 0 - 32 IU/L  TSH   Collection Time: 06/15/23 10:14 AM  Result Value Ref Range   TSH 1.100 0.450 - 4.500 uIU/mL  Lipid Panel w/o Chol/HDL Ratio   Collection Time: 06/15/23 10:14 AM  Result Value Ref Range   Cholesterol, Total 134 100 - 199 mg/dL   Triglycerides 628 0 - 149 mg/dL   HDL 53 >31 mg/dL   VLDL Cholesterol Cal 20 5 - 40 mg/dL   LDL Chol Calc (NIH) 61 0 - 99 mg/dL  VITAMIN D  25 Hydroxy (Vit-D Deficiency, Fractures)   Collection Time: 06/15/23 10:14 AM  Result Value Ref Range   Vit D, 25-Hydroxy 41.8 30.0 - 100.0 ng/mL      Assessment & Plan:   Problem List Items Addressed This Visit       Cardiovascular and Mediastinum   Hypertension associated with diabetes (HCC)   Chronic, stable.  BP well below goal.  Continue current medication regimen and collaboration with cardiology.  Continue to monitor BP at home regularly and focus on DASH diet.  LABS: CMP.  Could consider Farxiga in future and stopping Chlorthalidone  -- to allow diuretic affect maintained but  extra kidney and diabetes protection. Urine ALB 80 April 2025.  Return in 6 months for follow-up.      Relevant Medications   olmesartan (BENICAR) 20 MG tablet   Other Relevant Orders   Bayer DCA Hb A1c Waived   Microalbumin, Urine Waived   Comprehensive metabolic panel with GFR   Aortic atherosclerosis (HCC)   Chronic, stable.  Noted on imaging 11/21/2017.  Educated patient on this finding.  Recommend continue statin and ASA daily for prevention.      Relevant Medications   olmesartan (BENICAR) 20 MG tablet   Other Relevant Orders   Comprehensive metabolic panel with GFR   Lipid Panel w/o Chol/HDL Ratio     Endocrine   Type 2 diabetes mellitus with proteinuria (HCC)   Chronic, ongoing.  Diet-controlled.  A1c 6.2% today.  Urine ALB 80 April 2025.  Check CMP today.   Could consider Farxiga in future and stopping Chlorthalidone  -- to allow diuretic affect maintained but extra kidney and diabetes protection. Consider nephrology referral if worsening CKD.  Refer to CKD plan for  further. - Eye and foot exams up to date - ARB and statin on board - Vaccinations up to date.      Relevant Medications   olmesartan (BENICAR) 20 MG tablet   Other Relevant Orders   Bayer DCA Hb A1c Waived   Microalbumin, Urine Waived   Type 2 diabetes mellitus with obesity (HCC) - Primary   Chronic, ongoing.  Diet-controlled.  A1c 6.2% today. Urine ALB 80 April 2025.  Check CMP today.   Could consider Farxiga in future and stopping Chlorthalidone  -- to allow diuretic affect maintained but extra kidney and diabetes protection. Consider nephrology referral if worsening CKD. - Eye and foot exams up to date - ARB and statin on board - Vaccinations up to date.      Relevant Medications   olmesartan (BENICAR) 20 MG tablet   Other Relevant Orders   Bayer DCA Hb A1c Waived   Microalbumin, Urine Waived   Hyperlipidemia associated with type 2 diabetes mellitus (HCC)   Chronic, ongoing.  Continue current  medication regimen and adjust as needed.  Lipid panel today.      Relevant Medications   olmesartan (BENICAR) 20 MG tablet   Other Relevant Orders   Bayer DCA Hb A1c Waived   Comprehensive metabolic panel with GFR   Lipid Panel w/o Chol/HDL Ratio     Genitourinary   CKD (chronic kidney disease) stage 3, GFR 30-59 ml/min (HCC)   Ongoing, stable.  Current kidney failure risk calculation based on last labs is 0.44% in 5 years.  Educated her on this calculation, it is less then 5% which is beneficial.  Continue Olmesartan for kidney protection with her proteinuria and renal dose medications as needed.  Avoid NSAIDs and contrast unless needed.  CMP today.  Could consider Farxiga in future and stopping Chlorthalidone  -- to allow diuretic affect maintained but extra kidney and diabetes protection CKD.  Urine ALB 80 April 2025.      Relevant Orders   Microalbumin, Urine Waived   Comprehensive metabolic panel with GFR     Other   Obesity   BMI 32.54.  Recommended eating smaller high protein, low fat meals more frequently and exercising 30 mins a day 5 times a week with a goal of 10-15lb weight loss in the next 3 months. Patient voiced their understanding and motivation to adhere to these recommendations.         Follow up plan: Return in about 4 weeks (around 01/11/2024) for HTN -- Olmesartan to 20 MG (reduced).

## 2023-12-14 NOTE — Assessment & Plan Note (Signed)
Chronic, stable.  Noted on imaging 11/21/2017.  Educated patient on this finding.  Recommend continue statin and ASA daily for prevention.

## 2023-12-14 NOTE — Assessment & Plan Note (Signed)
 Chronic, ongoing.  Diet-controlled.  A1c 6.2% today.  Urine ALB 80 April 2025.  Check CMP today.   Could consider Farxiga in future and stopping Chlorthalidone  -- to allow diuretic affect maintained but extra kidney and diabetes protection. Consider nephrology referral if worsening CKD.  Refer to CKD plan for further. - Eye and foot exams up to date - ARB and statin on board - Vaccinations up to date.

## 2023-12-14 NOTE — Assessment & Plan Note (Signed)
 Chronic, ongoing.  Continue current medication regimen and adjust as needed. Lipid panel today.

## 2023-12-14 NOTE — Assessment & Plan Note (Signed)
 Chronic, stable.  BP well below goal.  Continue current medication regimen and collaboration with cardiology.  Continue to monitor BP at home regularly and focus on DASH diet.  LABS: CMP.  Could consider Farxiga in future and stopping Chlorthalidone  -- to allow diuretic affect maintained but extra kidney and diabetes protection. Urine ALB 80 April 2025.  Return in 6 months for follow-up.

## 2023-12-14 NOTE — Assessment & Plan Note (Signed)
 Ongoing, stable.  Current kidney failure risk calculation based on last labs is 0.44% in 5 years.  Educated her on this calculation, it is less then 5% which is beneficial.  Continue Olmesartan for kidney protection with her proteinuria and renal dose medications as needed.  Avoid NSAIDs and contrast unless needed.  CMP today.  Could consider Farxiga in future and stopping Chlorthalidone  -- to allow diuretic affect maintained but extra kidney and diabetes protection CKD.  Urine ALB 80 April 2025.

## 2023-12-14 NOTE — Assessment & Plan Note (Signed)
 Chronic, ongoing.  Diet-controlled.  A1c 6.2% today. Urine ALB 80 April 2025.  Check CMP today.   Could consider Farxiga in future and stopping Chlorthalidone  -- to allow diuretic affect maintained but extra kidney and diabetes protection. Consider nephrology referral if worsening CKD. - Eye and foot exams up to date - ARB and statin on board - Vaccinations up to date.

## 2023-12-14 NOTE — Assessment & Plan Note (Signed)
BMI 32.54.  Recommended eating smaller high protein, low fat meals more frequently and exercising 30 mins a day 5 times a week with a goal of 10-15lb weight loss in the next 3 months. Patient voiced their understanding and motivation to adhere to these recommendations.

## 2023-12-15 ENCOUNTER — Encounter: Payer: Self-pay | Admitting: Nurse Practitioner

## 2023-12-15 LAB — LIPID PANEL W/O CHOL/HDL RATIO
Cholesterol, Total: 119 mg/dL (ref 100–199)
HDL: 52 mg/dL (ref >39)
LDL Chol Calc (NIH): 54 mg/dL (ref 0–99)
Triglycerides: 56 mg/dL (ref 0–149)
VLDL Cholesterol Cal: 13 mg/dL (ref 5–40)

## 2023-12-15 LAB — COMPREHENSIVE METABOLIC PANEL WITH GFR
ALT: 12 IU/L (ref 0–32)
AST: 19 IU/L (ref 0–40)
Albumin: 3.7 g/dL — ABNORMAL LOW (ref 3.8–4.8)
Alkaline Phosphatase: 69 IU/L (ref 44–121)
BUN/Creatinine Ratio: 11 — ABNORMAL LOW (ref 12–28)
BUN: 13 mg/dL (ref 8–27)
Bilirubin Total: 0.4 mg/dL (ref 0.0–1.2)
CO2: 24 mmol/L (ref 20–29)
Calcium: 8.9 mg/dL (ref 8.7–10.3)
Chloride: 107 mmol/L — ABNORMAL HIGH (ref 96–106)
Creatinine, Ser: 1.2 mg/dL — ABNORMAL HIGH (ref 0.57–1.00)
Globulin, Total: 2.2 g/dL (ref 1.5–4.5)
Glucose: 97 mg/dL (ref 70–99)
Potassium: 3.9 mmol/L (ref 3.5–5.2)
Sodium: 142 mmol/L (ref 134–144)
Total Protein: 5.9 g/dL — ABNORMAL LOW (ref 6.0–8.5)
eGFR: 46 mL/min/{1.73_m2} — ABNORMAL LOW (ref >59)

## 2023-12-15 NOTE — Progress Notes (Signed)
 Contacted via MyChart   Good afternoon Denise Morris, your labs have returned: - Kidney function, creatinine and eGFR, shows ongoing chronic kidney disease Stage 3a with no worsening.  We will continue to monitor. - Lipid panel looks great.  No medication changes needed.  Any questions? Keep being amazing!!  Thank you for allowing me to participate in your care.  I appreciate you. Kindest regards, Gerard Cantara

## 2023-12-22 ENCOUNTER — Ambulatory Visit: Payer: Self-pay

## 2023-12-22 VITALS — Ht 65.5 in | Wt 198.0 lb

## 2023-12-22 DIAGNOSIS — Z0000 Encounter for general adult medical examination without abnormal findings: Secondary | ICD-10-CM

## 2023-12-22 NOTE — Progress Notes (Signed)
 Subjective:   Denise Morris is a 78 y.o. who presents for a Medicare Wellness preventive visit.  Visit Complete: Virtual I connected with  Azyria B Augustus on 12/22/23 by a audio enabled telemedicine application and verified that I am speaking with the correct person using two identifiers.  Patient Location: Home  Provider Location: Home Office  I discussed the limitations of evaluation and management by telemedicine. The patient expressed understanding and agreed to proceed.  Vital Signs: Because this visit was a virtual/telehealth visit, some criteria may be missing or patient reported. Any vitals not documented were not able to be obtained and vitals that have been documented are patient reported.  VideoDeclined- This patient declined Librarian, academic. Therefore the visit was completed with audio only.  Persons Participating in Visit: Patient.  AWV Questionnaire: Yes: Patient Medicare AWV questionnaire was completed by the patient on 12/18/23; I have confirmed that all information answered by patient is correct and no changes since this date.  Cardiac Risk Factors include: advanced age (>33men, >48 women);diabetes mellitus;hypertension;dyslipidemia;obesity (BMI >30kg/m2)     Objective:    Today's Vitals   12/22/23 0919  Weight: 198 lb (89.8 kg)  Height: 5' 5.5" (1.664 m)   Body mass index is 32.45 kg/m.     12/22/2023    9:30 AM 12/06/2022   10:34 AM 09/21/2021   11:41 AM 09/21/2021   11:22 AM 01/01/2019    9:41 AM 10/22/2018   11:11 AM 11/09/2017    9:38 AM  Advanced Directives  Does Patient Have a Medical Advance Directive? Yes No Yes Yes Yes Yes Yes  Type of Advance Directive Living will;Healthcare Power of Physicist, medical Power of Attorney Living will;Healthcare Power of [employer] Power of [place];Living will Healthcare Power of [place];Living will  Does patient want to make changes to medical advance  directive? No - Patient declined   No - Patient declined     Copy of Healthcare Power of Attorney in Chart? No - copy requested  No - copy requested No - copy requested No - copy requested  No - copy requested  Would patient like information on creating a medical advance directive?  No - Patient declined         Current Medications (verified) Outpatient Encounter Medications as of 12/22/2023  Medication Sig   aspirin 81 MG tablet Take 81 mg by mouth daily.   Blood Glucose Monitoring Suppl (ONETOUCH VERIO) w/Device KIT Use to check blood sugar 3 times a day and document results, bring to appointments.  Goal is <130 fasting blood sugar and <180 two hours after meals.   chlorthalidone  (HYGROTON ) 25 MG tablet Take 1 tablet (25 mg total) by mouth daily.   cholecalciferol  (VITAMIN D3) 25 MCG (1000 UNIT) tablet Take 1,000 Units by mouth daily.   glucose blood (ONETOUCH VERIO) test strip Use to check blood sugar 3 times a day and document results, bring to appointments.  Goal is <130 fasting blood sugar and <180 two hours after meals.   hydrocortisone  2.5 % cream APPLY TOPICALLY 2 TIMES DAILY AS NEEDED.   metoprolol  succinate (TOPROL -XL) 50 MG 24 hr tablet TAKE 1 TABLET BY MOUTH EVERY DAY WITH OR IMMEDIATELY FOLLOWING A MEAL   nitroGLYCERIN  (NITROSTAT ) 0.3 MG SL tablet Place 1 tablet (0.3 mg total) under the tongue every 5 (five) minutes as needed.   olmesartan (BENICAR) 20 MG tablet Take 20 mg by mouth daily.   OneTouch UltraSoft 2  Lancets MISC 1 applicator by Does not apply route in the morning and at bedtime. Use to check blood sugar 3 times a day and document results, bring to appointments.  Goal is <130 fasting blood sugar and <180 two hours after meals.   rosuvastatin (CRESTOR) 20 MG tablet TAKE 1 TABLET BY MOUTH EVERY DAY   No facility-administered encounter medications on file as of 12/22/2023.    Allergies (verified) Citalopram hydrobromide, Penicillins, and Zestril [lisinopril]    History: Past Medical History:  Diagnosis Date   Anxiety    Depression    Edema    Heart palpitations    Hyperlipidemia    Hypertension    Menopausal state    Osteopenia    Past Surgical History:  Procedure Laterality Date   ABDOMINAL HYSTERECTOMY  1978   Partial ( Precancerous cells)   Family History  Problem Relation Age of Onset   Alzheimer's disease Mother    Hypertension Father    Hypertension Sister    Hyperlipidemia Sister    Breast cancer Neg Hx    Social History   Socioeconomic History   Marital status: Widowed    Spouse name: Not on file   Number of children: 1   Years of education: Not on file   Highest education level: Master's degree (e.g., MA, MS, MEng, MEd, MSW, MBA)  Occupational History   Occupation: retired  Tobacco Use   Smoking status: Never    Passive exposure: Never   Smokeless tobacco: Never  Vaping Use   Vaping status: Never Used  Substance and Sexual Activity   Alcohol use: No   Drug use: No   Sexual activity: Yes  Other Topics Concern   Not on file  Social History Narrative   NARFE- meet monthly   NCNW- meet monthly   Social Drivers of Health   Financial Resource Strain: Low Risk  (12/22/2023)   Overall Financial Resource Strain (CARDIA)    Difficulty of Paying Living Expenses: Not hard at all  Food Insecurity: No Food Insecurity (12/22/2023)   Hunger Vital Sign    Worried About Running Out of Food in the Last Year: Never true    Ran Out of Food in the Last Year: Never true  Transportation Needs: No Transportation Needs (12/22/2023)   PRAPARE - Administrator, Civil Service (Medical): No    Lack of Transportation (Non-Medical): No  Physical Activity: Insufficiently Active (12/22/2023)   Exercise Vital Sign    Days of Exercise per Week: 3 days    Minutes of Exercise per Session: 20 min  Stress: No Stress Concern Present (12/22/2023)   [employer] of Occupational Health - Occupational Stress Questionnaire     Feeling of Stress : Not at all  Social Connections: Moderately Integrated (12/22/2023)   Social Connection and Isolation Panel [NHANES]    Frequency of Communication with Friends and Family: More than three times a week    Frequency of Social Gatherings with Friends and Family: More than three times a week    Attends Religious Services: More than 4 times per year    Active Member of [employer] or Organizations: Yes    Attends Banker Meetings: More than 4 times per year    Marital Status: Widowed    Tobacco Counseling Counseling given: Not Answered    Clinical Intake:  Pre-visit preparation completed: Yes  Pain : No/denies pain     BMI - recorded: 32.45 Nutritional Status: BMI > 30  Obese Nutritional  Risks: None Diabetes: Yes CBG done?: No Did pt. bring in CBG monitor from home?: No  Lab Results  Component Value Date   HGBA1C 6.2 (H) 12/14/2023   HGBA1C 6.4 (H) 06/15/2023   HGBA1C 6.6 (H) 12/10/2022     How often do you need to have someone help you when you read instructions, pamphlets, or other written materials from your doctor or pharmacy?: 1 - Never  Interpreter Needed?: No  Information entered by :: Jaunita Messier, CMA   Activities of Daily Living     12/22/2023    9:22 AM 12/18/2023    4:17 PM  In your present state of health, do you have any difficulty performing the following activities:  Hearing? 0 0  Vision? 0 0  Difficulty concentrating or making decisions? 0 0  Walking or climbing stairs? 0 0  Dressing or bathing? 0 0  Doing errands, shopping? 0 0  Preparing Food and eating ? N N  Using the Toilet? N N  In the past six months, have you accidently leaked urine? N N  Do you have problems with loss of bowel control? N N  Managing your Medications? N N  Managing your Finances? N N  Housekeeping or managing your Housekeeping? N N    Patient Care Team: Cannady, Jolene T, NP as PCP - General (Nurse Practitioner) Reche Canales, OD  (Optometry) Cherrie Cornwall, MD as Consulting Physician (Cardiology)  Indicate any recent Medical Services you may have received from other than [facility] providers in the past year (date may be approximate).     Assessment:    This is a routine wellness examination for Crystalmarie.  Hearing/Vision screen Hearing Screening - Comments:: Denies hearing loss Vision Screening - Comments:: Gets DM eye exams, Dr. Barrie Borer, [place]   Goals Addressed               This Visit's Progress     Patient Stated (pt-stated)        Lose 20 lbs       Depression Screen     12/22/2023    9:28 AM 12/14/2023   10:02 AM 06/15/2023   10:06 AM 12/10/2022   10:32 AM 12/06/2022   10:32 AM 06/10/2022   10:15 AM 12/09/2021   11:04 AM  PHQ 2/9 Scores  PHQ - 2 Score 0 0 0 0 0 0 0  PHQ- 9 Score 0 1 0 1 0 1 1    Fall Risk     12/22/2023    9:31 AM 12/18/2023    4:17 PM 12/14/2023   10:01 AM 06/15/2023   10:30 AM 12/10/2022   10:32 AM  Fall Risk   Falls in the past year? 0 0 0 0 0  Number falls in past yr: 0  0 0 0  Injury with Fall? 0  0 0 0  Risk for fall due to : No Fall Risks  No Fall Risks No Fall Risks No Fall Risks  Follow up Falls prevention discussed;Falls evaluation completed  Falls evaluation completed Education provided Falls evaluation completed    MEDICARE RISK AT HOME:  Medicare Risk at Home Any stairs in or around the home?: Yes If so, are there any without handrails?: No Home free of loose throw rugs in walkways, pet beds, electrical cords, etc?: Yes Adequate lighting in your home to reduce risk of falls?: Yes Life alert?: No Use of a cane, walker or w/c?: No Grab bars in the bathroom?: Yes Shower  chair or bench in shower?: Yes Elevated toilet seat or a handicapped toilet?: No  TIMED UP AND GO:  Was the test performed?  No  Cognitive Function: 6CIT completed        12/22/2023    9:32 AM 12/06/2022   10:39 AM 01/01/2019    9:42 AM 11/09/2017    9:38 AM  6CIT Screen  What  Year? 0 points 0 points 0 points 0 points  What month? 0 points 0 points 0 points 0 points  What time? 0 points 0 points 0 points 0 points  Count back from 20 0 points 0 points 0 points 0 points  Months in reverse 0 points 0 points 0 points 0 points  Repeat phrase 0 points 0 points 0 points 0 points  Total Score 0 points 0 points 0 points 0 points    Immunizations Immunization History  Administered Date(s) Administered   Fluad Quad(high Dose 65+) 06/04/2019, 06/12/2022   Influenza, High Dose Seasonal PF 05/31/2018   Influenza-Unspecified 05/31/2021   PFIZER Comirnaty(Gray Top)Covid-19 Tri-Sucrose Vaccine 12/08/2020   PFIZER(Purple Top)SARS-COV-2 Vaccination 09/10/2019, 10/01/2019, 05/22/2020, 06/21/2021   Pfizer Covid-19 Vaccine Bivalent Booster 57yrs & up 06/21/2021, 05/24/2022   Pfizer(Comirnaty)Fall Seasonal Vaccine 12 years and older 05/24/2022, 05/16/2023   Pneumococcal Conjugate-13 08/13/2014   Pneumococcal Polysaccharide-23 04/06/2011   Td 03/20/2003   Zoster Recombinant(Shingrix ) 11/21/2017, 05/24/2021   Zoster, Live 04/04/2007    Screening Tests Health Maintenance  Topic Date Due   OPHTHALMOLOGY EXAM  01/05/2023   COVID-19 Vaccine (9 - 2024-25 season) 12/27/2023 (Originally 11/13/2023)   DTaP/Tdap/Td (2 - Tdap) 06/14/2024 (Originally 03/19/2013)   INFLUENZA VACCINE  03/16/2024   HEMOGLOBIN A1C  06/14/2024   MAMMOGRAM  10/10/2024   Diabetic kidney evaluation - eGFR measurement  12/13/2024   Diabetic kidney evaluation - Urine ACR  12/13/2024   FOOT EXAM  12/13/2024   Medicare Annual Wellness (AWV)  12/21/2024   DEXA SCAN  10/05/2025   Pneumonia Vaccine 101+ Years old  Completed   Hepatitis C Screening  Completed   Zoster Vaccines- Shingrix   Completed   HPV VACCINES  Aged Out   Meningococcal B Vaccine  Aged Out   Colonoscopy  Discontinued    Health Maintenance  Health Maintenance Due  Topic Date Due   OPHTHALMOLOGY EXAM  01/05/2023   Health Maintenance Items  Addressed: See Nurse Notes  Additional Screening:  Vision Screening: Recommended annual ophthalmology exams for early detection of glaucoma and other disorders of the eye.  Dental Screening: Recommended annual dental exams for proper oral hygiene  Community Resource Referral / Chronic Care Management: CRR required this visit?  No   CCM required this visit?  No     Plan:     I have personally reviewed and noted the following in the patient's chart:   Medical and social history Use of alcohol, tobacco or illicit drugs  Current medications and supplements including opioid prescriptions. Patient is not currently taking opioid prescriptions. Functional ability and status Nutritional status Physical activity Advanced directives List of other physicians Hospitalizations, surgeries, and ER visits in previous 12 months Vitals Screenings to include cognitive, depression, and falls Referrals and appointments  In addition, I have reviewed and discussed with patient certain preventive protocols, quality metrics, and best practice recommendations. A written personalized care plan for preventive services as well as general preventive health recommendations were provided to patient.     Jaunita Messier, CMA   12/22/2023   After Visit Summary: (MyChart) Due to  this being a telephonic visit, the after visit summary with patients personalized plan was offered to patient via MyChart   Notes: Please refer to Routing Comments.

## 2023-12-22 NOTE — Patient Instructions (Addendum)
 Denise Morris , Thank you for taking time to come for your Medicare Wellness Visit. I appreciate your ongoing commitment to your health goals. Please review the following plan we discussed and let me know if I can assist you in the future.   Referrals/Orders/Follow-Ups/Clinician Recommendations: Get a tetanus vaccine at your local pharmacy. I have requested the most recent diabetic eye exam from Dr Rosan Comfort. Keep up the good work!  This is a list of the screening recommended for you and due dates:  Health Maintenance  Topic Date Due   Eye exam for diabetics  01/05/2023   COVID-19 Vaccine (9 - 2024-25 season) 12/27/2023*   DTaP/Tdap/Td vaccine (2 - Tdap) 06/14/2024*   Flu Shot  03/16/2024   Hemoglobin A1C  06/14/2024   Mammogram  10/10/2024   Yearly kidney function blood test for diabetes  12/13/2024   Yearly kidney health urinalysis for diabetes  12/13/2024   Complete foot exam   12/13/2024   Medicare Annual Wellness Visit  12/21/2024   DEXA scan (bone density measurement)  10/05/2025   Pneumonia Vaccine  Completed   Hepatitis C Screening  Completed   Zoster (Shingles) Vaccine  Completed   HPV Vaccine  Aged Out   Meningitis B Vaccine  Aged Out   Colon Cancer Screening  Discontinued  *Topic was postponed. The date shown is not the original due date.    Advanced directives: (Copy Requested) Please bring a copy of your health care power of attorney and living will to the office to be added to your chart at your convenience. You can mail to [facility] [address] [place], [place] [address] or email to [email or web address]  Next Medicare Annual Wellness Visit scheduled for next year: Yes, 01/03/25 @ 9:20am (phone visit)  Have you seen your provider in the last 6 months (3 months if uncontrolled diabetes)? Yes, next appointment 01/13/24

## 2023-12-29 ENCOUNTER — Ambulatory Visit: Payer: Medicare Other | Admitting: Cardiovascular Disease

## 2024-01-05 ENCOUNTER — Ambulatory Visit (INDEPENDENT_AMBULATORY_CARE_PROVIDER_SITE_OTHER): Admitting: Cardiovascular Disease

## 2024-01-05 ENCOUNTER — Encounter: Payer: Self-pay | Admitting: Cardiovascular Disease

## 2024-01-05 VITALS — BP 120/62 | HR 68 | Ht 66.0 in | Wt 199.0 lb

## 2024-01-05 DIAGNOSIS — Q245 Malformation of coronary vessels: Secondary | ICD-10-CM

## 2024-01-05 DIAGNOSIS — E785 Hyperlipidemia, unspecified: Secondary | ICD-10-CM

## 2024-01-05 DIAGNOSIS — R002 Palpitations: Secondary | ICD-10-CM

## 2024-01-05 DIAGNOSIS — I700 Atherosclerosis of aorta: Secondary | ICD-10-CM

## 2024-01-05 DIAGNOSIS — I152 Hypertension secondary to endocrine disorders: Secondary | ICD-10-CM

## 2024-01-05 DIAGNOSIS — E1169 Type 2 diabetes mellitus with other specified complication: Secondary | ICD-10-CM

## 2024-01-05 DIAGNOSIS — E1159 Type 2 diabetes mellitus with other circulatory complications: Secondary | ICD-10-CM

## 2024-01-05 NOTE — Progress Notes (Signed)
 Cardiology Office Note   Date:  01/05/2024   ID:  Denise Morris, DOB [birth year], MRN [id number]  PCP:  Lemar Pyles, NP  Cardiologist:  Debborah Fairly, MD      History of Present Illness: Denise Morris is a 78 y.o. female who presents for  Chief Complaint  Patient presents with   Follow-up    6 month follow up    Patient has occasional palpitation requiring extra dose of metoprolol  but overall has been doing well.      Past Medical History:  Diagnosis Date   Anxiety    Depression    Edema    Heart palpitations    Hyperlipidemia    Hypertension    Menopausal state    Osteopenia      Past Surgical History:  Procedure Laterality Date   ABDOMINAL HYSTERECTOMY  1978   Partial ( Precancerous cells)     Current Outpatient Medications  Medication Sig Dispense Refill   aspirin 81 MG tablet Take 81 mg by mouth daily.     Blood Glucose Monitoring Suppl (ONETOUCH VERIO) w/Device KIT Use to check blood sugar 3 times a day and document results, bring to appointments.  Goal is <130 fasting blood sugar and <180 two hours after meals. 1 kit 0   chlorthalidone  (HYGROTON ) 25 MG tablet Take 1 tablet (25 mg total) by mouth daily. 90 tablet 4   cholecalciferol  (VITAMIN D3) 25 MCG (1000 UNIT) tablet Take 1,000 Units by mouth daily.     glucose blood (ONETOUCH VERIO) test strip Use to check blood sugar 3 times a day and document results, bring to appointments.  Goal is <130 fasting blood sugar and <180 two hours after meals. 100 each 12   hydrocortisone  2.5 % cream APPLY TOPICALLY 2 TIMES DAILY AS NEEDED. 28.35 g 3   metoprolol  succinate (TOPROL -XL) 50 MG 24 hr tablet TAKE 1 TABLET BY MOUTH EVERY DAY WITH OR IMMEDIATELY FOLLOWING A MEAL 90 tablet 4   nitroGLYCERIN  (NITROSTAT ) 0.3 MG SL tablet Place 1 tablet (0.3 mg total) under the tongue every 5 (five) minutes as needed. 90 tablet 1   olmesartan (BENICAR) 20 MG tablet Take 20 mg by mouth daily.     OneTouch UltraSoft 2  Lancets MISC 1 applicator by Does not apply route in the morning and at bedtime. Use to check blood sugar 3 times a day and document results, bring to appointments.  Goal is <130 fasting blood sugar and <180 two hours after meals. 100 each 3   rosuvastatin (CRESTOR) 20 MG tablet TAKE 1 TABLET BY MOUTH EVERY DAY 90 tablet 2   No current facility-administered medications for this visit.    Allergies:   Citalopram hydrobromide, Penicillins, and Zestril [lisinopril]    Social History:   reports that she has never smoked. She has never been exposed to tobacco smoke. She has never used smokeless tobacco. She reports that she does not drink alcohol and does not use drugs.   Family History:  family history includes Alzheimer's disease in her mother; Hyperlipidemia in her sister; Hypertension in her father and sister.    ROS:     Review of Systems  Constitutional: Negative.   HENT: Negative.    Eyes: Negative.   Respiratory: Negative.    Gastrointestinal: Negative.   Genitourinary: Negative.   Musculoskeletal: Negative.   Skin: Negative.   Neurological: Negative.   Endo/Heme/Allergies: Negative.   Psychiatric/Behavioral: Negative.    All other systems reviewed and  are negative.     All other systems are reviewed and negative.    PHYSICAL EXAM: VS:  BP 120/62   Pulse 68   Ht 5\' 6"  (1.676 m)   Wt 199 lb (90.3 kg)   LMP  (LMP Unknown)   SpO2 96%   BMI 32.12 kg/m  , BMI Body mass index is 32.12 kg/m. Last weight:  Wt Readings from Last 3 Encounters:  01/05/24 199 lb (90.3 kg)  12/22/23 198 lb (89.8 kg)  12/14/23 201 lb 9.6 oz (91.4 kg)     Physical Exam Constitutional:      Appearance: Normal appearance.  Cardiovascular:     Rate and Rhythm: Normal rate and regular rhythm.     Heart sounds: Normal heart sounds.  Pulmonary:     Effort: Pulmonary effort is normal.     Breath sounds: Normal breath sounds.  Musculoskeletal:     Right lower leg: No edema.     Left lower  leg: No edema.  Neurological:     Mental Status: She is alert.       EKG:   Recent Labs: 06/15/2023: Hemoglobin 13.6; Platelets 142; TSH 1.100 12/14/2023: ALT 12; BUN 13; Creatinine, Ser 1.20; Potassium 3.9; Sodium 142    Lipid Panel    Component Value Date/Time   CHOL 119 12/14/2023 1003   CHOL 148 02/12/2015 1022   TRIG 56 12/14/2023 1003   TRIG 80 02/12/2015 1022   HDL 52 12/14/2023 1003   CHOLHDL 3.7 05/31/2018 1353   VLDL 16 02/12/2015 1022   LDLCALC 54 12/14/2023 1003      Other studies Reviewed: Additional studies/ records that were reviewed today include:  Review of the above records demonstrates:       No data to display            ASSESSMENT AND PLAN:    ICD-10-CM   1. Palpitation  R00.2    Infrequent palpitation and no chest pain.  Patient is stable.    2. 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 Aortic atherosclerosis (HCC)  I70.0     4. Hypertension associated with diabetes (HCC)  E11.59    I15.2     5. Hyperlipidemia associated with type 2 diabetes mellitus (HCC)  E11.69    E78.5        Problem List Items Addressed This Visit       Cardiovascular and Mediastinum   Hypertension associated with diabetes (HCC)   Anomalous coronary artery origin   Aortic atherosclerosis (HCC)     Endocrine   Hyperlipidemia associated with type 2 diabetes mellitus (HCC)   Other Visit Diagnoses       Palpitation    -  Primary   Infrequent palpitation and no chest pain.  Patient is stable.          Disposition:   Return in about 3 months (around 04/06/2024).    Total time spent: 35 minutes  Signed,  Debborah Fairly, MD  01/05/2024 10:43 AM    [facility]

## 2024-01-08 NOTE — Patient Instructions (Signed)

## 2024-01-11 DIAGNOSIS — I10 Essential (primary) hypertension: Secondary | ICD-10-CM | POA: Diagnosis not present

## 2024-01-11 DIAGNOSIS — H40013 Open angle with borderline findings, low risk, bilateral: Secondary | ICD-10-CM | POA: Diagnosis not present

## 2024-01-11 DIAGNOSIS — H524 Presbyopia: Secondary | ICD-10-CM | POA: Diagnosis not present

## 2024-01-11 DIAGNOSIS — H35033 Hypertensive retinopathy, bilateral: Secondary | ICD-10-CM | POA: Diagnosis not present

## 2024-01-11 DIAGNOSIS — H2513 Age-related nuclear cataract, bilateral: Secondary | ICD-10-CM | POA: Diagnosis not present

## 2024-01-11 DIAGNOSIS — D23121 Other benign neoplasm of skin of left upper eyelid, including canthus: Secondary | ICD-10-CM | POA: Diagnosis not present

## 2024-01-13 ENCOUNTER — Encounter: Payer: Self-pay | Admitting: Nurse Practitioner

## 2024-01-13 ENCOUNTER — Ambulatory Visit: Admitting: Nurse Practitioner

## 2024-01-13 VITALS — BP 108/64 | HR 64 | Temp 98.0°F | Ht 66.0 in | Wt 200.6 lb

## 2024-01-13 DIAGNOSIS — I152 Hypertension secondary to endocrine disorders: Secondary | ICD-10-CM

## 2024-01-13 DIAGNOSIS — E1159 Type 2 diabetes mellitus with other circulatory complications: Secondary | ICD-10-CM | POA: Diagnosis not present

## 2024-01-13 NOTE — Progress Notes (Signed)
 BP 108/64 (BP Location: Left Arm, Patient Position: Sitting, Cuff Size: Normal)   Pulse 64   Temp 98 F (36.7 C) (Oral)   Ht 5\' 6"  (1.676 m)   Wt 200 lb 9.6 oz (91 kg)   LMP  (LMP Unknown)   SpO2 97%   BMI 32.38 kg/m    Subjective:    Patient ID: Denise Morris, female    DOB: 11-29-1945, 78 y.o.   MRN: [id number]  HPI: Denise Morris is a 78 y.o. female  Chief Complaint  Patient presents with   Hypertension   HYPERTENSION with Chronic Kidney Disease Follow-up today for hypotension, reduced Olmesartan to 20 MG on 12/14/23.  She continues Chorthalidone and Metoprolol  with this.  Last saw cardiology on 01/05/24 -- BP was 120/62. Hypertension status: stable  Satisfied with current treatment? yes Duration of hypertension: chronic BP monitoring frequency:  not checking BP range:  BP medication side effects:  no Medication compliance: good compliance Aspirin: yes Recurrent headaches: no Visual changes: no Palpitations: no Dyspnea: no Chest pain: no Lower extremity edema: no Dizzy/lightheaded: no   Relevant past medical, surgical, family and social history reviewed and updated as indicated. Interim medical history since our last visit reviewed. Allergies and medications reviewed and updated.  Review of Systems  Constitutional:  Negative for activity change, appetite change, diaphoresis, fatigue and fever.  Respiratory:  Negative for cough, chest tightness and shortness of breath.   Cardiovascular:  Negative for chest pain, palpitations and leg swelling.  Gastrointestinal: Negative.   Neurological: Negative.   Psychiatric/Behavioral: Negative.      Per HPI unless specifically indicated above     Objective:     BP 108/64 (BP Location: Left Arm, Patient Position: Sitting, Cuff Size: Normal)   Pulse 64   Temp 98 F (36.7 C) (Oral)   Ht 5\' 6"  (1.676 m)   Wt 200 lb 9.6 oz (91 kg)   LMP  (LMP Unknown)   SpO2 97%   BMI 32.38 kg/m   Wt Readings from Last 3  Encounters:  01/13/24 200 lb 9.6 oz (91 kg)  01/05/24 199 lb (90.3 kg)  12/22/23 198 lb (89.8 kg)    Physical Exam Vitals and nursing note reviewed.  Constitutional:      General: She is awake. She is not in acute distress.    Appearance: She is well-developed and well-groomed. She is obese. She is not ill-appearing.  HENT:     Head: Normocephalic.     Right Ear: Hearing normal.     Left Ear: Hearing normal.  Eyes:     General: Lids are normal.        Right eye: No discharge.        Left eye: No discharge.     Conjunctiva/sclera: Conjunctivae normal.     Pupils: Pupils are equal, round, and reactive to light.  Neck:     Thyroid : No thyromegaly.     Vascular: No carotid bruit.  Cardiovascular:     Rate and Rhythm: Normal rate and regular rhythm.     Heart sounds: Normal heart sounds. No murmur heard.    No gallop.  Pulmonary:     Effort: Pulmonary effort is normal. No accessory muscle usage or respiratory distress.     Breath sounds: Normal breath sounds.  Abdominal:     General: Bowel sounds are normal.     Palpations: Abdomen is soft.  Musculoskeletal:     Cervical back: Normal range of motion and  neck supple.     Right lower leg: 2+ Edema present.     Left lower leg: 2+ Edema present.  Skin:    General: Skin is warm and dry.  Neurological:     Mental Status: She is alert and oriented to person, place, and time.  Psychiatric:        Attention and Perception: Attention normal.        Mood and Affect: Mood normal.        Speech: Speech normal.        Behavior: Behavior normal. Behavior is cooperative.        Thought Content: Thought content normal.    Results for orders placed or performed in visit on 12/14/23  Bayer DCA Hb A1c Waived   Collection Time: 12/14/23 10:02 AM  Result Value Ref Range   HB A1C (BAYER DCA - WAIVED) 6.2 (H) 4.8 - 5.6 %  Microalbumin, Urine Waived   Collection Time: 12/14/23 10:02 AM  Result Value Ref Range   Microalb, Ur Waived 80 (H) 0  - 19 mg/L   Creatinine, Urine Waived 200 10 - 300 mg/dL   Microalb/Creat Ratio 30-300 (H) <30 mg/g  Comprehensive metabolic panel with GFR   Collection Time: 12/14/23 10:03 AM  Result Value Ref Range   Glucose 97 70 - 99 mg/dL   BUN 13 8 - 27 mg/dL   Creatinine, Ser 1.61 (H) 0.57 - 1.00 mg/dL   eGFR 46 (L) >09 UE/AVW/0.98   BUN/Creatinine Ratio 11 (L) 12 - 28   Sodium 142 134 - 144 mmol/L   Potassium 3.9 3.5 - 5.2 mmol/L   Chloride 107 (H) 96 - 106 mmol/L   CO2 24 20 - 29 mmol/L   Calcium 8.9 8.7 - 10.3 mg/dL   Total Protein 5.9 (L) 6.0 - 8.5 g/dL   Albumin 3.7 (L) 3.8 - 4.8 g/dL   Globulin, Total 2.2 1.5 - 4.5 g/dL   Bilirubin Total 0.4 0.0 - 1.2 mg/dL   Alkaline Phosphatase 69 44 - 121 IU/L   AST 19 0 - 40 IU/L   ALT 12 0 - 32 IU/L  Lipid Panel w/o Chol/HDL Ratio   Collection Time: 12/14/23 10:03 AM  Result Value Ref Range   Cholesterol, Total 119 100 - 199 mg/dL   Triglycerides 56 0 - 149 mg/dL   HDL 52 >11 mg/dL   VLDL Cholesterol Cal 13 5 - 40 mg/dL   LDL Chol Calc (NIH) 54 0 - 99 mg/dL      Assessment & Plan:   Problem List Items Addressed This Visit       Cardiovascular and Mediastinum   Hypertension associated with diabetes (HCC) - Primary   Chronic, stable.  BP well below goal.  Continue current medication regimen and collaboration with cardiology.  She will alert PCP when her 40 MG Olmesartan tablets are complete, she is 1/2ing them.  Then will send in 20 MG tablet.  Continue to monitor BP at home regularly and focus on DASH diet.  LABS: CMP.  Could consider Farxiga in future and stopping Chlorthalidone  -- to allow diuretic affect maintained but extra kidney and diabetes protection. Urine ALB 80 April 2025.          Follow up plan: Return in about 5 months (around 06/18/2024) for T2DM, HTN/HLD,CKD.

## 2024-01-13 NOTE — Assessment & Plan Note (Signed)
 Chronic, stable.  BP well below goal.  Continue current medication regimen and collaboration with cardiology.  She will alert PCP when her 40 MG Olmesartan tablets are complete, she is 1/2ing them.  Then will send in 20 MG tablet.  Continue to monitor BP at home regularly and focus on DASH diet.  LABS: CMP.  Could consider Farxiga in future and stopping Chlorthalidone  -- to allow diuretic affect maintained but extra kidney and diabetes protection. Urine ALB 80 April 2025.

## 2024-05-05 ENCOUNTER — Other Ambulatory Visit: Payer: Self-pay | Admitting: Cardiovascular Disease

## 2024-05-05 DIAGNOSIS — E785 Hyperlipidemia, unspecified: Secondary | ICD-10-CM

## 2024-05-31 ENCOUNTER — Ambulatory Visit: Admitting: Cardiovascular Disease

## 2024-06-07 ENCOUNTER — Ambulatory Visit (INDEPENDENT_AMBULATORY_CARE_PROVIDER_SITE_OTHER): Admitting: Cardiovascular Disease

## 2024-06-07 ENCOUNTER — Encounter: Payer: Self-pay | Admitting: Cardiovascular Disease

## 2024-06-07 VITALS — BP 110/68 | HR 63 | Ht 65.0 in | Wt 197.8 lb

## 2024-06-07 DIAGNOSIS — I152 Hypertension secondary to endocrine disorders: Secondary | ICD-10-CM | POA: Diagnosis not present

## 2024-06-07 DIAGNOSIS — E6609 Other obesity due to excess calories: Secondary | ICD-10-CM | POA: Diagnosis not present

## 2024-06-07 DIAGNOSIS — Q245 Malformation of coronary vessels: Secondary | ICD-10-CM | POA: Diagnosis not present

## 2024-06-07 DIAGNOSIS — E1159 Type 2 diabetes mellitus with other circulatory complications: Secondary | ICD-10-CM | POA: Diagnosis not present

## 2024-06-07 DIAGNOSIS — R002 Palpitations: Secondary | ICD-10-CM

## 2024-06-07 DIAGNOSIS — E785 Hyperlipidemia, unspecified: Secondary | ICD-10-CM | POA: Diagnosis not present

## 2024-06-07 DIAGNOSIS — E66811 Obesity, class 1: Secondary | ICD-10-CM | POA: Diagnosis not present

## 2024-06-07 DIAGNOSIS — Z6832 Body mass index (BMI) 32.0-32.9, adult: Secondary | ICD-10-CM | POA: Diagnosis not present

## 2024-06-07 DIAGNOSIS — E1169 Type 2 diabetes mellitus with other specified complication: Secondary | ICD-10-CM | POA: Diagnosis not present

## 2024-06-07 NOTE — Progress Notes (Signed)
 Cardiology Office Note   Date:  06/07/2024   ID:  Denise Morris, DOB Aug 23, 1945, MRN [id number]  PCP:  Valerio Melanie DASEN, NP  Cardiologist:  Denyse Bathe, MD      History of Present Illness: Denise Morris is a 78 y.o. female who presents for  Chief Complaint  Patient presents with   Follow-up    3 month follow up    Has palpitation twice a month and had to take extra metoprolol .      Past Medical History:  Diagnosis Date   Anxiety    Depression    Edema    Heart palpitations    Hyperlipidemia    Hypertension    Menopausal state    Osteopenia      Past Surgical History:  Procedure Laterality Date   ABDOMINAL HYSTERECTOMY  1978   Partial ( Precancerous cells)     Current Outpatient Medications  Medication Sig Dispense Refill   aspirin 81 MG tablet Take 81 mg by mouth daily.     Blood Glucose Monitoring Suppl (ONETOUCH VERIO) w/Device KIT Use to check blood sugar 3 times a day and document results, bring to appointments.  Goal is <130 fasting blood sugar and <180 two hours after meals. 1 kit 0   chlorthalidone  (HYGROTON ) 25 MG tablet Take 1 tablet (25 mg total) by mouth daily. 90 tablet 4   cholecalciferol  (VITAMIN D3) 25 MCG (1000 UNIT) tablet Take 1,000 Units by mouth daily.     glucose blood (ONETOUCH VERIO) test strip Use to check blood sugar 3 times a day and document results, bring to appointments.  Goal is <130 fasting blood sugar and <180 two hours after meals. 100 each 12   hydrocortisone  2.5 % cream APPLY TOPICALLY 2 TIMES DAILY AS NEEDED. 28.35 g 3   metoprolol  succinate (TOPROL -XL) 50 MG 24 hr tablet TAKE 1 TABLET BY MOUTH EVERY DAY WITH OR IMMEDIATELY FOLLOWING A MEAL 90 tablet 4   nitroGLYCERIN  (NITROSTAT ) 0.3 MG SL tablet Place 1 tablet (0.3 mg total) under the tongue every 5 (five) minutes as needed. 90 tablet 1   olmesartan (BENICAR) 20 MG tablet Take 20 mg by mouth daily.     OneTouch UltraSoft 2 Lancets MISC 1 applicator by Does not apply  route in the morning and at bedtime. Use to check blood sugar 3 times a day and document results, bring to appointments.  Goal is <130 fasting blood sugar and <180 two hours after meals. 100 each 3   rosuvastatin (CRESTOR) 20 MG tablet TAKE 1 TABLET BY MOUTH EVERY DAY 90 tablet 2   No current facility-administered medications for this visit.    Allergies:   Citalopram hydrobromide, Penicillins, and Zestril [lisinopril]    Social History:   reports that she has never smoked. She has never been exposed to tobacco smoke. She has never used smokeless tobacco. She reports that she does not drink alcohol and does not use drugs.   Family History:  family history includes Alzheimer's disease in her mother; Hyperlipidemia in her sister; Hypertension in her father and sister.    ROS:     Review of Systems  Constitutional: Negative.   HENT: Negative.    Eyes: Negative.   Respiratory: Negative.    Gastrointestinal: Negative.   Genitourinary: Negative.   Musculoskeletal: Negative.   Skin: Negative.   Neurological: Negative.   Endo/Heme/Allergies: Negative.   Psychiatric/Behavioral: Negative.    All other systems reviewed and are negative.  All other systems are reviewed and negative.    PHYSICAL EXAM: VS:  BP 110/68   Pulse 63   Ht 5' 5 (1.651 m)   Wt 197 lb 12.8 oz (89.7 kg)   LMP  (LMP Unknown)   SpO2 99%   BMI 32.92 kg/m  , BMI Body mass index is 32.92 kg/m. Last weight:  Wt Readings from Last 3 Encounters:  06/07/24 197 lb 12.8 oz (89.7 kg)  01/13/24 200 lb 9.6 oz (91 kg)  01/05/24 199 lb (90.3 kg)     Physical Exam Constitutional:      Appearance: Normal appearance.  Cardiovascular:     Rate and Rhythm: Normal rate and regular rhythm.     Heart sounds: Normal heart sounds.  Pulmonary:     Effort: Pulmonary effort is normal.     Breath sounds: Normal breath sounds.  Musculoskeletal:     Right lower leg: No edema.     Left lower leg: No edema.  Neurological:      Mental Status: She is alert.       EKG:   Recent Labs: 06/15/2023: Hemoglobin 13.6; Platelets 142; TSH 1.100 12/14/2023: ALT 12; BUN 13; Creatinine, Ser 1.20; Potassium 3.9; Sodium 142    Lipid Panel    Component Value Date/Time   CHOL 119 12/14/2023 1003   CHOL 148 02/12/2015 1022   TRIG 56 12/14/2023 1003   TRIG 80 02/12/2015 1022   HDL 52 12/14/2023 1003   CHOLHDL 3.7 05/31/2018 1353   VLDL 16 02/12/2015 1022   LDLCALC 54 12/14/2023 1003      Other studies Reviewed: Additional studies/ records that were reviewed today include:  Review of the above records demonstrates:       No data to display            ASSESSMENT AND PLAN:    ICD-10-CM   1. Anomalous coronary artery origin  Q24.5 PCV ECHOCARDIOGRAM COMPLETE    2. Hypertension associated with diabetes (HCC)  E11.59 PCV ECHOCARDIOGRAM COMPLETE   I15.2    BP stable    3. Hyperlipidemia associated with type 2 diabetes mellitus (HCC)  E11.69 PCV ECHOCARDIOGRAM COMPLETE   E78.5     4. Class 1 obesity due to excess calories without serious comorbidity with body mass index (BMI) of 32.0 to 32.9 in adult  E66.811 PCV ECHOCARDIOGRAM COMPLETE   E66.09    Z68.32     5. Palpitation  R00.2 PCV ECHOCARDIOGRAM COMPLETE   Infrequent, get echo       Problem List Items Addressed This Visit       Cardiovascular and Mediastinum   Hypertension associated with diabetes (HCC)   Relevant Orders   PCV ECHOCARDIOGRAM COMPLETE   Anomalous coronary artery origin - Primary   Relevant Orders   PCV ECHOCARDIOGRAM COMPLETE     Endocrine   Hyperlipidemia associated with type 2 diabetes mellitus (HCC)   Relevant Orders   PCV ECHOCARDIOGRAM COMPLETE     Other   Obesity   Relevant Orders   PCV ECHOCARDIOGRAM COMPLETE   Other Visit Diagnoses       Palpitation       Infrequent, get echo   Relevant Orders   PCV ECHOCARDIOGRAM COMPLETE          Disposition:   Return for echo and f/u.    Total time  spent: 40 minutes  Signed,  Denyse Bathe, MD  06/07/2024 10:56 AM    [facility]

## 2024-06-10 NOTE — Patient Instructions (Signed)

## 2024-06-14 ENCOUNTER — Ambulatory Visit (INDEPENDENT_AMBULATORY_CARE_PROVIDER_SITE_OTHER): Admitting: Nurse Practitioner

## 2024-06-14 ENCOUNTER — Encounter: Payer: Self-pay | Admitting: Nurse Practitioner

## 2024-06-14 VITALS — BP 115/64 | HR 62 | Temp 98.5°F | Ht 65.3 in | Wt 198.2 lb

## 2024-06-14 DIAGNOSIS — E6609 Other obesity due to excess calories: Secondary | ICD-10-CM

## 2024-06-14 DIAGNOSIS — E1159 Type 2 diabetes mellitus with other circulatory complications: Secondary | ICD-10-CM | POA: Diagnosis not present

## 2024-06-14 DIAGNOSIS — E119 Type 2 diabetes mellitus without complications: Secondary | ICD-10-CM

## 2024-06-14 DIAGNOSIS — E1169 Type 2 diabetes mellitus with other specified complication: Secondary | ICD-10-CM | POA: Diagnosis not present

## 2024-06-14 DIAGNOSIS — N1831 Chronic kidney disease, stage 3a: Secondary | ICD-10-CM

## 2024-06-14 DIAGNOSIS — E785 Hyperlipidemia, unspecified: Secondary | ICD-10-CM

## 2024-06-14 DIAGNOSIS — I700 Atherosclerosis of aorta: Secondary | ICD-10-CM | POA: Diagnosis not present

## 2024-06-14 DIAGNOSIS — I152 Hypertension secondary to endocrine disorders: Secondary | ICD-10-CM | POA: Diagnosis not present

## 2024-06-14 DIAGNOSIS — E66811 Obesity, class 1: Secondary | ICD-10-CM | POA: Diagnosis not present

## 2024-06-14 DIAGNOSIS — E669 Obesity, unspecified: Secondary | ICD-10-CM

## 2024-06-14 DIAGNOSIS — R809 Proteinuria, unspecified: Secondary | ICD-10-CM

## 2024-06-14 DIAGNOSIS — Z23 Encounter for immunization: Secondary | ICD-10-CM

## 2024-06-14 DIAGNOSIS — E1129 Type 2 diabetes mellitus with other diabetic kidney complication: Secondary | ICD-10-CM | POA: Diagnosis not present

## 2024-06-14 LAB — BAYER DCA HB A1C WAIVED: HB A1C (BAYER DCA - WAIVED): 6.2 % — ABNORMAL HIGH (ref 4.8–5.6)

## 2024-06-14 MED ORDER — CHLORTHALIDONE 25 MG PO TABS: 25.0000 mg | ORAL_TABLET | Freq: Every day | ORAL | 4 refills | Status: AC

## 2024-06-14 MED ORDER — ROSUVASTATIN CALCIUM 20 MG PO TABS: 20.0000 mg | ORAL_TABLET | Freq: Every day | ORAL | 4 refills | Status: AC

## 2024-06-14 MED ORDER — METOPROLOL SUCCINATE ER 50 MG PO TB24: ORAL_TABLET | ORAL | 4 refills | Status: AC

## 2024-06-14 NOTE — Assessment & Plan Note (Signed)
BMI 32.68.  Recommended eating smaller high protein, low fat meals more frequently and exercising 30 mins a day 5 times a week with a goal of 10-15lb weight loss in the next 3 months. Patient voiced their understanding and motivation to adhere to these recommendations.

## 2024-06-14 NOTE — Assessment & Plan Note (Signed)
 Chronic, ongoing.  Continue current medication regimen and adjust as needed. Lipid panel today.

## 2024-06-14 NOTE — Assessment & Plan Note (Signed)
 Ongoing, stable.  Current kidney failure risk calculation based on last labs is 1.3% in 5 years, if gets to 3-5 % over 5 years will refer to nephrology.  Educated her on this calculation, it is less then 5% which is beneficial.  Continue Olmesartan for kidney protection with her proteinuria and renal dose medications as needed.  Avoid NSAIDs and contrast unless needed.  CMP today.  Could consider Farxiga in future and stopping Chlorthalidone  -- to allow diuretic affect maintained but extra kidney and diabetes protection CKD.  Urine ALB 80 April 2025.

## 2024-06-14 NOTE — Progress Notes (Signed)
 BP 115/64   Pulse 62   Temp 98.5 F (36.9 C) (Oral)   Ht 5' 5.3 (1.659 m)   Wt 198 lb 3.2 oz (89.9 kg)   LMP  (LMP Unknown)   SpO2 98%   BMI 32.68 kg/m    Subjective:    Patient ID: Denise Morris, female    DOB: Jan 08, 1946, 78 y.o.   MRN: [id number]  HPI: Denise Morris is a 78 y.o. female  Chief Complaint  Patient presents with   Chronic Kidney Disease   Diabetes   Hyperlipidemia   Hypertension   DIABETES A1c initially elevated at 6.6% in August 2021. April 6.2%  Diet controlled at this time. Bought an elliptical and is using this, has been on twice. Hypoglycemic episodes:no Polydipsia/polyuria: no Visual disturbance: no Chest pain: no Paresthesias: no Glucose Monitoring: no             Accucheck frequency: occasional             Fasting glucose: 97 to 117             Post prandial:             Evening:             Before meals: Taking Insulin?: no             Long acting insulin:             Short acting insulin: Blood Pressure Monitoring: weekly Retinal Examination: Up to Date -- Dr. Laurice, will get records Foot Exam: Up to Date Pneumovax: Up to Date Influenza: Up To Date Aspirin: yes    HYPERTENSION / HYPERLIPIDEMIA Takes Olmesartan 40 MG, Metoprolol  XL 50 MG, Hygroton  25 MG, and ASA + Crestor.  Dr. Fernand, cardiology, last visit was 06/07/24. Last echo in chart October 2023 == EF 76%, has echo scheduled for this year.  Aortic atherosclerosis on imaging, CXR in 2019. Satisfied with current treatment? yes Duration of hypertension: chronic BP monitoring frequency: 2-3 times a week BP range: averages <130/80 BP medication side effects: no Duration of hyperlipidemia: chronic Cholesterol medication side effects: no Cholesterol supplements: none Medication compliance: good compliance Aspirin: yes Recent stressors: no Recurrent headaches: no Visual changes: no Palpitations: occasional, was told to take extra Metoprolol  if this happens, cardiology Dyspnea:  no Chest pain: no Lower extremity edema: at baseline, better in the winter months Dizzy/lightheaded: no   CHRONIC KIDNEY DISEASE (Stage 3a) First noted on labs 02/12/15 and then more consistent in 2021. CKD status: stable Medications renally dose: yes Previous renal evaluation: yes Pneumovax:  Up to Date Influenza Vaccine:  Up to Date   Relevant past medical, surgical, family and social history reviewed and updated as indicated. Interim medical history since our last visit reviewed. Allergies and medications reviewed and updated.  Review of Systems  Constitutional:  Negative for activity change, appetite change, diaphoresis, fatigue and fever.  Respiratory:  Negative for cough, chest tightness and shortness of breath.   Cardiovascular:  Positive for leg swelling. Negative for chest pain and palpitations.  Gastrointestinal: Negative.   Neurological: Negative.   Psychiatric/Behavioral: Negative.     Per HPI unless specifically indicated above     Objective:    BP 115/64   Pulse 62   Temp 98.5 F (36.9 C) (Oral)   Ht 5' 5.3 (1.659 m)   Wt 198 lb 3.2 oz (89.9 kg)   LMP  (LMP Unknown)   SpO2 98%   BMI  32.68 kg/m   Wt Readings from Last 3 Encounters:  06/14/24 198 lb 3.2 oz (89.9 kg)  06/07/24 197 lb 12.8 oz (89.7 kg)  01/13/24 200 lb 9.6 oz (91 kg)    Physical Exam Vitals and nursing note reviewed.  Constitutional:      General: She is awake. She is not in acute distress.    Appearance: She is well-developed and well-groomed. She is obese. She is not ill-appearing.  HENT:     Head: Normocephalic.     Right Ear: Hearing normal.     Left Ear: Hearing normal.  Eyes:     General: Lids are normal.        Right eye: No discharge.        Left eye: No discharge.     Conjunctiva/sclera: Conjunctivae normal.     Pupils: Pupils are equal, round, and reactive to light.  Neck:     Thyroid : No thyromegaly.     Vascular: No carotid bruit.  Cardiovascular:     Rate and  Rhythm: Normal rate and regular rhythm.     Heart sounds: Normal heart sounds. No murmur heard.    No gallop.  Pulmonary:     Effort: Pulmonary effort is normal. No accessory muscle usage or respiratory distress.     Breath sounds: Normal breath sounds.  Abdominal:     General: Bowel sounds are normal.     Palpations: Abdomen is soft.  Musculoskeletal:     Cervical back: Normal range of motion and neck supple.     Right lower leg: 2+ Edema present.     Left lower leg: 2+ Edema present.  Skin:    General: Skin is warm and dry.  Neurological:     Mental Status: She is alert and oriented to person, place, and time.  Psychiatric:        Attention and Perception: Attention normal.        Mood and Affect: Mood normal.        Speech: Speech normal.        Behavior: Behavior normal. Behavior is cooperative.        Thought Content: Thought content normal.    Results for orders placed or performed in visit on 12/14/23  Bayer DCA Hb A1c Waived   Collection Time: 12/14/23 10:02 AM  Result Value Ref Range   HB A1C (BAYER DCA - WAIVED) 6.2 (H) 4.8 - 5.6 %  Microalbumin, Urine Waived   Collection Time: 12/14/23 10:02 AM  Result Value Ref Range   Microalb, Ur Waived 80 (H) 0 - 19 mg/L   Creatinine, Urine Waived 200 10 - 300 mg/dL   Microalb/Creat Ratio 30-300 (H) <30 mg/g  Comprehensive metabolic panel with GFR   Collection Time: 12/14/23 10:03 AM  Result Value Ref Range   Glucose 97 70 - 99 mg/dL   BUN 13 8 - 27 mg/dL   Creatinine, Ser 8.79 (H) 0.57 - 1.00 mg/dL   eGFR 46 (L) >40 fO/fpw/8.26   BUN/Creatinine Ratio 11 (L) 12 - 28   Sodium 142 134 - 144 mmol/L   Potassium 3.9 3.5 - 5.2 mmol/L   Chloride 107 (H) 96 - 106 mmol/L   CO2 24 20 - 29 mmol/L   Calcium 8.9 8.7 - 10.3 mg/dL   Total Protein 5.9 (L) 6.0 - 8.5 g/dL   Albumin 3.7 (L) 3.8 - 4.8 g/dL   Globulin, Total 2.2 1.5 - 4.5 g/dL   Bilirubin Total 0.4 0.0 - 1.2  mg/dL   Alkaline Phosphatase 69 44 - 121 IU/L   AST 19 0 - 40  IU/L   ALT 12 0 - 32 IU/L  Lipid Panel w/o Chol/HDL Ratio   Collection Time: 12/14/23 10:03 AM  Result Value Ref Range   Cholesterol, Total 119 100 - 199 mg/dL   Triglycerides 56 0 - 149 mg/dL   HDL 52 >60 mg/dL   VLDL Cholesterol Cal 13 5 - 40 mg/dL   LDL Chol Calc (NIH) 54 0 - 99 mg/dL      Assessment & Plan:   Problem List Items Addressed This Visit       Cardiovascular and Mediastinum   Hypertension associated with diabetes (HCC)   Chronic, stable.  BP well below goal.  Continue current medication regimen and collaboration with cardiology.  Continue to monitor BP at home regularly and focus on DASH diet.  LABS: CMP.  Could consider Farxiga in future and stopping Chlorthalidone  -- to allow diuretic affect maintained but extra kidney and diabetes protection. Urine ALB 80 April 2025.        Relevant Medications   olmesartan (BENICAR) 40 MG tablet   chlorthalidone  (HYGROTON ) 25 MG tablet   metoprolol  succinate (TOPROL -XL) 50 MG 24 hr tablet   rosuvastatin (CRESTOR) 20 MG tablet   Other Relevant Orders   Bayer DCA Hb A1c Waived   Basic metabolic panel with GFR   Aortic atherosclerosis   Chronic, stable.  Noted on imaging 11/21/2017.  Educated patient on this finding.  Recommend continue statin and ASA daily for prevention.      Relevant Medications   olmesartan (BENICAR) 40 MG tablet   chlorthalidone  (HYGROTON ) 25 MG tablet   metoprolol  succinate (TOPROL -XL) 50 MG 24 hr tablet   rosuvastatin (CRESTOR) 20 MG tablet     Endocrine   Type 2 diabetes mellitus with proteinuria (HCC) - Primary   Chronic, ongoing.  Diet-controlled.  A1c 6.2% today.  Urine ALB 80 April 2025.  Check CMP today.   Could consider Farxiga in future and stopping Chlorthalidone  -- to allow diuretic affect maintained but extra kidney and diabetes protection. Consider nephrology referral if worsening CKD.  Refer to CKD plan for further. - Eye and foot exams up to date - ARB and statin on board - Vaccinations  up to date.      Relevant Medications   olmesartan (BENICAR) 40 MG tablet   rosuvastatin (CRESTOR) 20 MG tablet   Other Relevant Orders   Bayer DCA Hb A1c Waived   Type 2 diabetes mellitus in patient with obesity (HCC)   Chronic, ongoing.  Diet-controlled.  A1c 6.2% today. Urine ALB 80 April 2025.  Check CMP today.   Could consider Farxiga in future and stopping Chlorthalidone  -- to allow diuretic affect maintained but extra kidney and diabetes protection. Consider nephrology referral if worsening CKD. - Eye and foot exams up to date - ARB and statin on board - Vaccinations up to date.      Relevant Medications   olmesartan (BENICAR) 40 MG tablet   rosuvastatin (CRESTOR) 20 MG tablet   Hyperlipidemia associated with type 2 diabetes mellitus (HCC)   Chronic, ongoing.  Continue current medication regimen and adjust as needed.  Lipid panel today.      Relevant Medications   olmesartan (BENICAR) 40 MG tablet   chlorthalidone  (HYGROTON ) 25 MG tablet   metoprolol  succinate (TOPROL -XL) 50 MG 24 hr tablet   rosuvastatin (CRESTOR) 20 MG tablet   Other Relevant Orders  Bayer DCA Hb A1c Waived   Lipid Panel w/o Chol/HDL Ratio     Genitourinary   CKD (chronic kidney disease) stage 3, GFR 30-59 ml/min (HCC)   Ongoing, stable.  Current kidney failure risk calculation based on last labs is 1.3% in 5 years, if gets to 3-5 % over 5 years will refer to nephrology.  Educated her on this calculation, it is less then 5% which is beneficial.  Continue Olmesartan for kidney protection with her proteinuria and renal dose medications as needed.  Avoid NSAIDs and contrast unless needed.  CMP today.  Could consider Farxiga in future and stopping Chlorthalidone  -- to allow diuretic affect maintained but extra kidney and diabetes protection CKD.  Urine ALB 80 April 2025.        Other   Obesity   BMI 32.68.  Recommended eating smaller high protein, low fat meals more frequently and exercising 30 mins a day  5 times a week with a goal of 10-15lb weight loss in the next 3 months. Patient voiced their understanding and motivation to adhere to these recommendations.         Follow up plan: Return in about 6 months (around 12/13/2024) for T2DM, HTN/HLD, CKD.

## 2024-06-14 NOTE — Assessment & Plan Note (Signed)
 Chronic, ongoing.  Diet-controlled.  A1c 6.2% today.  Urine ALB 80 April 2025.  Check CMP today.   Could consider Farxiga in future and stopping Chlorthalidone  -- to allow diuretic affect maintained but extra kidney and diabetes protection. Consider nephrology referral if worsening CKD.  Refer to CKD plan for further. - Eye and foot exams up to date - ARB and statin on board - Vaccinations up to date.

## 2024-06-14 NOTE — Assessment & Plan Note (Signed)
 Chronic, stable.  BP well below goal.  Continue current medication regimen and collaboration with cardiology.  Continue to monitor BP at home regularly and focus on DASH diet.  LABS: CMP.  Could consider Farxiga in future and stopping Chlorthalidone  -- to allow diuretic affect maintained but extra kidney and diabetes protection. Urine ALB 80 April 2025.

## 2024-06-14 NOTE — Assessment & Plan Note (Signed)
 Chronic, ongoing.  Diet-controlled.  A1c 6.2% today. Urine ALB 80 April 2025.  Check CMP today.   Could consider Farxiga in future and stopping Chlorthalidone  -- to allow diuretic affect maintained but extra kidney and diabetes protection. Consider nephrology referral if worsening CKD. - Eye and foot exams up to date - ARB and statin on board - Vaccinations up to date.

## 2024-06-14 NOTE — Assessment & Plan Note (Signed)
Chronic, stable.  Noted on imaging 11/21/2017.  Educated patient on this finding.  Recommend continue statin and ASA daily for prevention.

## 2024-06-15 ENCOUNTER — Ambulatory Visit: Payer: Self-pay | Admitting: Nurse Practitioner

## 2024-06-15 LAB — BASIC METABOLIC PANEL WITH GFR
BUN/Creatinine Ratio: 17 (ref 12–28)
BUN: 21 mg/dL (ref 8–27)
CO2: 25 mmol/L (ref 20–29)
Calcium: 9.1 mg/dL (ref 8.7–10.3)
Chloride: 103 mmol/L (ref 96–106)
Creatinine, Ser: 1.24 mg/dL — ABNORMAL HIGH (ref 0.57–1.00)
Glucose: 101 mg/dL — ABNORMAL HIGH (ref 70–99)
Potassium: 3.7 mmol/L (ref 3.5–5.2)
Sodium: 141 mmol/L (ref 134–144)
eGFR: 44 mL/min/1.73 — ABNORMAL LOW (ref >59)

## 2024-06-15 LAB — LIPID PANEL W/O CHOL/HDL RATIO
Cholesterol, Total: 140 mg/dL (ref 100–199)
HDL: 58 mg/dL (ref >39)
LDL Chol Calc (NIH): 70 mg/dL (ref 0–99)
Triglycerides: 59 mg/dL (ref 0–149)
VLDL Cholesterol Cal: 12 mg/dL (ref 5–40)

## 2024-06-15 NOTE — Progress Notes (Signed)
 Contacted via MyChart  Good afternoon Denise Morris, your labs have returned: - Kidney function continues to show Stage 3a to b kidney disease with no worsening. - Remainder of labs stable.  No changes needed.  Any questions? Keep being amazing!!  Thank you for allowing me to participate in your care.  I appreciate you. Kindest regards, [name]

## 2024-06-27 ENCOUNTER — Ambulatory Visit

## 2024-06-27 DIAGNOSIS — I361 Nonrheumatic tricuspid (valve) insufficiency: Secondary | ICD-10-CM

## 2024-06-27 DIAGNOSIS — E66811 Obesity, class 1: Secondary | ICD-10-CM

## 2024-06-27 DIAGNOSIS — E1169 Type 2 diabetes mellitus with other specified complication: Secondary | ICD-10-CM

## 2024-06-27 DIAGNOSIS — Q245 Malformation of coronary vessels: Secondary | ICD-10-CM

## 2024-06-27 DIAGNOSIS — I371 Nonrheumatic pulmonary valve insufficiency: Secondary | ICD-10-CM

## 2024-06-27 DIAGNOSIS — E1159 Type 2 diabetes mellitus with other circulatory complications: Secondary | ICD-10-CM

## 2024-06-27 DIAGNOSIS — I351 Nonrheumatic aortic (valve) insufficiency: Secondary | ICD-10-CM

## 2024-06-27 DIAGNOSIS — I340 Nonrheumatic mitral (valve) insufficiency: Secondary | ICD-10-CM

## 2024-06-27 DIAGNOSIS — R002 Palpitations: Secondary | ICD-10-CM

## 2024-07-05 ENCOUNTER — Ambulatory Visit (INDEPENDENT_AMBULATORY_CARE_PROVIDER_SITE_OTHER): Admitting: Cardiovascular Disease

## 2024-07-05 ENCOUNTER — Encounter: Payer: Self-pay | Admitting: Cardiovascular Disease

## 2024-07-05 VITALS — BP 120/78 | HR 64 | Ht 65.0 in | Wt 199.2 lb

## 2024-07-05 DIAGNOSIS — I351 Nonrheumatic aortic (valve) insufficiency: Secondary | ICD-10-CM

## 2024-07-05 DIAGNOSIS — Q245 Malformation of coronary vessels: Secondary | ICD-10-CM

## 2024-07-05 DIAGNOSIS — E1159 Type 2 diabetes mellitus with other circulatory complications: Secondary | ICD-10-CM

## 2024-07-05 DIAGNOSIS — I152 Hypertension secondary to endocrine disorders: Secondary | ICD-10-CM

## 2024-07-05 DIAGNOSIS — E66811 Obesity, class 1: Secondary | ICD-10-CM | POA: Diagnosis not present

## 2024-07-05 DIAGNOSIS — E1169 Type 2 diabetes mellitus with other specified complication: Secondary | ICD-10-CM | POA: Diagnosis not present

## 2024-07-05 DIAGNOSIS — I340 Nonrheumatic mitral (valve) insufficiency: Secondary | ICD-10-CM | POA: Diagnosis not present

## 2024-07-05 DIAGNOSIS — Z6832 Body mass index (BMI) 32.0-32.9, adult: Secondary | ICD-10-CM

## 2024-07-05 DIAGNOSIS — E6609 Other obesity due to excess calories: Secondary | ICD-10-CM | POA: Diagnosis not present

## 2024-07-05 DIAGNOSIS — E785 Hyperlipidemia, unspecified: Secondary | ICD-10-CM

## 2024-07-05 DIAGNOSIS — R002 Palpitations: Secondary | ICD-10-CM

## 2024-07-05 NOTE — Progress Notes (Signed)
 Cardiology Office Note   Date:  07/05/2024   ID:  Denise Morris, DOB 1945/12/16, MRN [id number]  PCP:  Valerio Melanie DASEN, NP  Cardiologist:  Denyse Bathe, MD      History of Present Illness: Denise Morris is a 78 y.o. female who presents for  Chief Complaint  Patient presents with   Follow-up    Echo follow up    Feeling fine      Past Medical History:  Diagnosis Date   Allergy    Anxiety    Depression    Edema    Heart palpitations    Hyperlipidemia    Hypertension    Menopausal state    Osteopenia      Past Surgical History:  Procedure Laterality Date   ABDOMINAL HYSTERECTOMY  1978   Partial ( Precancerous cells)     Current Outpatient Medications  Medication Sig Dispense Refill   aspirin 81 MG tablet Take 81 mg by mouth daily.     Blood Glucose Monitoring Suppl (ONETOUCH VERIO) w/Device KIT Use to check blood sugar 3 times a day and document results, bring to appointments.  Goal is <130 fasting blood sugar and <180 two hours after meals. 1 kit 0   chlorthalidone  (HYGROTON ) 25 MG tablet Take 1 tablet (25 mg total) by mouth daily. 90 tablet 4   cholecalciferol  (VITAMIN D3) 25 MCG (1000 UNIT) tablet Take 1,000 Units by mouth daily.     glucose blood (ONETOUCH VERIO) test strip Use to check blood sugar 3 times a day and document results, bring to appointments.  Goal is <130 fasting blood sugar and <180 two hours after meals. 100 each 12   hydrocortisone  2.5 % cream APPLY TOPICALLY 2 TIMES DAILY AS NEEDED. 28.35 g 3   metoprolol  succinate (TOPROL -XL) 50 MG 24 hr tablet TAKE 1 TABLET BY MOUTH EVERY DAY WITH OR IMMEDIATELY FOLLOWING A MEAL 90 tablet 4   nitroGLYCERIN  (NITROSTAT ) 0.3 MG SL tablet Place 1 tablet (0.3 mg total) under the tongue every 5 (five) minutes as needed. 90 tablet 1   olmesartan (BENICAR) 40 MG tablet Take 40 mg by mouth daily.     OneTouch UltraSoft 2 Lancets MISC 1 applicator by Does not apply route in the morning and at bedtime. Use  to check blood sugar 3 times a day and document results, bring to appointments.  Goal is <130 fasting blood sugar and <180 two hours after meals. 100 each 3   rosuvastatin (CRESTOR) 20 MG tablet Take 1 tablet (20 mg total) by mouth daily. 90 tablet 4   No current facility-administered medications for this visit.    Allergies:   Citalopram hydrobromide, Penicillins, and Zestril [lisinopril]    Social History:   reports that she has never smoked. She has never been exposed to tobacco smoke. She has never used smokeless tobacco. She reports that she does not drink alcohol and does not use drugs.   Family History:  family history includes Alzheimer's disease in her mother; Hyperlipidemia in her sister; Hypertension in her father and sister.    ROS:     Review of Systems  Constitutional: Negative.   HENT: Negative.    Eyes: Negative.   Respiratory: Negative.    Gastrointestinal: Negative.   Genitourinary: Negative.   Musculoskeletal: Negative.   Skin: Negative.   Neurological: Negative.   Endo/Heme/Allergies: Negative.   Psychiatric/Behavioral: Negative.    All other systems reviewed and are negative.     All other  systems are reviewed and negative.    PHYSICAL EXAM: VS:  BP 120/78   Pulse 64   Ht 5' 5 (1.651 m)   Wt 199 lb 3.2 oz (90.4 kg)   LMP  (LMP Unknown)   SpO2 98%   BMI 33.15 kg/m  , BMI Body mass index is 33.15 kg/m. Last weight:  Wt Readings from Last 3 Encounters:  07/05/24 199 lb 3.2 oz (90.4 kg)  06/14/24 198 lb 3.2 oz (89.9 kg)  06/07/24 197 lb 12.8 oz (89.7 kg)     Physical Exam Constitutional:      Appearance: Normal appearance.  Cardiovascular:     Rate and Rhythm: Normal rate and regular rhythm.     Heart sounds: Normal heart sounds.  Pulmonary:     Effort: Pulmonary effort is normal.     Breath sounds: Normal breath sounds.  Musculoskeletal:     Right lower leg: No edema.     Left lower leg: No edema.  Neurological:     Mental Status:  She is alert.       EKG:   Recent Labs: 12/14/2023: ALT 12 06/14/2024: BUN 21; Creatinine, Ser 1.24; Potassium 3.7; Sodium 141    Lipid Panel    Component Value Date/Time   CHOL 140 06/14/2024 1027   CHOL 148 02/12/2015 1022   TRIG 59 06/14/2024 1027   TRIG 80 02/12/2015 1022   HDL 58 06/14/2024 1027   CHOLHDL 3.7 05/31/2018 1353   VLDL 16 02/12/2015 1022   LDLCALC 70 06/14/2024 1027      Other studies Reviewed: Additional studies/ records that were reviewed today include:  Review of the above records demonstrates:       No data to display            ASSESSMENT AND PLAN:    ICD-10-CM   1. 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 Hypertension associated with diabetes (HCC)  E11.59    I15.2     3. Hyperlipidemia associated with type 2 diabetes mellitus (HCC)  E11.69    E78.5     4. Palpitation  R00.2     5. Class 1 obesity due to excess calories without serious comorbidity with body mass index (BMI) of 32.0 to 32.9 in adult  E66.811    E66.09    Z68.32     6. Nonrheumatic mitral valve regurgitation  I34.0    Trcae MR/TR/AR LVEF 60%    7. Nonrheumatic aortic valve insufficiency  I35.1        Problem List Items Addressed This Visit       Cardiovascular and Mediastinum   Hypertension associated with diabetes (HCC)   Anomalous coronary artery origin - Primary     Endocrine   Hyperlipidemia associated with type 2 diabetes mellitus (HCC)     Other   Obesity   Other Visit Diagnoses       Palpitation         Nonrheumatic mitral valve regurgitation       Trcae MR/TR/AR LVEF 60%     Nonrheumatic aortic valve insufficiency              Disposition:   Return in about 3 months (around 10/05/2024).    Total time spent: 35 minutes  Signed,  Denyse Bathe, MD  07/05/2024 11:09 AM    [facility]

## 2024-07-06 ENCOUNTER — Ambulatory Visit: Admitting: Cardiovascular Disease

## 2024-07-31 ENCOUNTER — Other Ambulatory Visit: Payer: Self-pay | Admitting: Cardiovascular Disease

## 2024-10-05 ENCOUNTER — Ambulatory Visit: Admitting: Cardiovascular Disease

## 2024-12-17 ENCOUNTER — Ambulatory Visit: Admitting: Nurse Practitioner

## 2025-01-03 ENCOUNTER — Ambulatory Visit
